# Patient Record
Sex: Female | Born: 1935
Health system: Southern US, Community
[De-identification: ages and names within clinical notes are randomized; demographics above are authoritative.]

---

## 2005-10-31 ENCOUNTER — Ambulatory Visit: Payer: Self-pay | Admitting: Internal Medicine

## 2005-12-10 ENCOUNTER — Other Ambulatory Visit: Payer: Self-pay

## 2005-12-10 ENCOUNTER — Inpatient Hospital Stay: Payer: Self-pay | Admitting: Internal Medicine

## 2005-12-23 ENCOUNTER — Ambulatory Visit: Payer: Self-pay

## 2006-11-13 ENCOUNTER — Other Ambulatory Visit: Payer: Self-pay

## 2006-11-13 ENCOUNTER — Inpatient Hospital Stay: Payer: Self-pay | Admitting: Internal Medicine

## 2006-11-14 ENCOUNTER — Other Ambulatory Visit: Payer: Self-pay

## 2007-10-29 ENCOUNTER — Emergency Department: Payer: Self-pay | Admitting: Emergency Medicine

## 2007-10-29 ENCOUNTER — Other Ambulatory Visit: Payer: Self-pay

## 2008-01-06 ENCOUNTER — Ambulatory Visit: Payer: Self-pay | Admitting: Cardiovascular Disease

## 2008-01-10 ENCOUNTER — Inpatient Hospital Stay: Payer: Self-pay | Admitting: Internal Medicine

## 2008-01-10 ENCOUNTER — Other Ambulatory Visit: Payer: Self-pay

## 2008-01-16 ENCOUNTER — Other Ambulatory Visit: Payer: Self-pay

## 2008-01-16 ENCOUNTER — Emergency Department: Payer: Self-pay | Admitting: Emergency Medicine

## 2009-12-15 ENCOUNTER — Emergency Department: Payer: Self-pay | Admitting: Emergency Medicine

## 2010-05-18 ENCOUNTER — Emergency Department: Payer: Self-pay | Admitting: Emergency Medicine

## 2010-11-07 ENCOUNTER — Ambulatory Visit: Payer: Self-pay | Admitting: Family Medicine

## 2011-01-31 ENCOUNTER — Ambulatory Visit: Payer: Self-pay

## 2011-03-22 ENCOUNTER — Inpatient Hospital Stay: Payer: Self-pay | Admitting: Student

## 2011-03-30 ENCOUNTER — Emergency Department: Payer: Self-pay | Admitting: Unknown Physician Specialty

## 2011-04-30 ENCOUNTER — Ambulatory Visit: Payer: Self-pay

## 2011-06-19 LAB — HEPATIC FUNCTION PANEL A (ARMC)
Albumin: 3.7 g/dL (ref 3.4–5.0)
Bilirubin, Direct: <0.1 mg/dL (ref 0.00–0.20)
SGOT(AST): 18 U/L (ref 15–37)
SGPT (ALT): 16 U/L
Total Protein: 7.3 g/dL (ref 6.4–8.2)

## 2011-06-19 LAB — CK TOTAL AND CKMB (NOT AT ARMC)
CK, Total: 56 U/L (ref 21–215)
CK-MB: <0.5 ng/mL — ABNORMAL LOW (ref 0.5–3.6)

## 2011-06-19 LAB — URINALYSIS, COMPLETE
Bilirubin,UR: NEGATIVE
Blood: NEGATIVE
Ph: 5 (ref 4.5–8.0)
Protein: NEGATIVE
RBC,UR: 3 /HPF (ref 0–5)
Squamous Epithelial: <1

## 2011-06-19 LAB — BASIC METABOLIC PANEL
Anion Gap: 10 (ref 7–16)
Calcium, Total: 9.1 mg/dL (ref 8.5–10.1)
Co2: 31 mmol/L (ref 21–32)
EGFR (Non-African Amer.): >60
Glucose: 103 mg/dL — ABNORMAL HIGH (ref 65–99)
Osmolality: 285 (ref 275–301)
Potassium: 3.3 mmol/L — ABNORMAL LOW (ref 3.5–5.1)

## 2011-06-19 LAB — CBC WITH DIFFERENTIAL/PLATELET
Eosinophil #: 0.1 10*3/uL (ref 0.0–0.7)
Eosinophil %: 1.2 %
HCT: 31.3 % — ABNORMAL LOW (ref 35.0–47.0)
Lymphocyte #: 2.2 10*3/uL (ref 1.0–3.6)
MCHC: 31.4 g/dL — ABNORMAL LOW (ref 32.0–36.0)
MCV: 79 fL — ABNORMAL LOW (ref 80–100)
Monocyte #: 0.4 10*3/uL (ref 0.0–0.7)
Neutrophil %: 63.3 %
Platelet: 184 10*3/uL (ref 150–440)
RDW: 18.2 % — ABNORMAL HIGH (ref 11.5–14.5)

## 2011-06-20 ENCOUNTER — Observation Stay: Payer: Self-pay | Admitting: Internal Medicine

## 2011-06-20 LAB — CBC WITH DIFFERENTIAL/PLATELET
Basophil #: 0 10*3/uL (ref 0.0–0.1)
Basophil %: 0.2 %
Eosinophil #: 0 10*3/uL (ref 0.0–0.7)
Eosinophil %: 0.1 %
HGB: 8.6 g/dL — ABNORMAL LOW (ref 12.0–16.0)
Lymphocyte %: 18.4 %
MCH: 25 pg — ABNORMAL LOW (ref 26.0–34.0)
MCHC: 31.5 g/dL — ABNORMAL LOW (ref 32.0–36.0)
MCV: 79 fL — ABNORMAL LOW (ref 80–100)
Monocyte #: 0.3 10*3/uL (ref 0.0–0.7)
Monocyte %: 4.6 %
Neutrophil %: 76.7 %
RBC: 3.42 10*6/uL — ABNORMAL LOW (ref 3.80–5.20)
RDW: 18 % — ABNORMAL HIGH (ref 11.5–14.5)
WBC: 7.4 10*3/uL (ref 3.6–11.0)

## 2011-06-20 LAB — IRON AND TIBC
Iron Bind.Cap.(Total): 185 ug/dL — ABNORMAL LOW (ref 250–450)
Iron: 33 ug/dL — ABNORMAL LOW (ref 50–170)

## 2011-06-20 LAB — BASIC METABOLIC PANEL
BUN: 19 mg/dL — ABNORMAL HIGH (ref 7–18)
Calcium, Total: 8 mg/dL — ABNORMAL LOW (ref 8.5–10.1)
Creatinine: 0.83 mg/dL (ref 0.60–1.30)
EGFR (Non-African Amer.): >60
Glucose: 142 mg/dL — ABNORMAL HIGH (ref 65–99)
Osmolality: 290 (ref 275–301)
Potassium: 3.7 mmol/L (ref 3.5–5.1)
Sodium: 143 mmol/L (ref 136–145)

## 2011-06-20 LAB — LIPID PANEL
Cholesterol: 90 mg/dL (ref 0–200)
HDL Cholesterol: 36 mg/dL — ABNORMAL LOW (ref 40–60)
Triglycerides: 48 mg/dL (ref 0–200)

## 2011-06-20 LAB — RAPID INFLUENZA A&B ANTIGENS

## 2011-06-20 LAB — FOLATE: Folic Acid: 19.1 ng/mL (ref 3.1–100.0)

## 2011-06-21 LAB — URINE CULTURE

## 2011-10-23 ENCOUNTER — Inpatient Hospital Stay: Payer: Self-pay | Admitting: Internal Medicine

## 2011-10-23 LAB — COMPREHENSIVE METABOLIC PANEL
Alkaline Phosphatase: 100 U/L (ref 50–136)
Calcium, Total: 9.3 mg/dL (ref 8.5–10.1)
Co2: 28 mmol/L (ref 21–32)
EGFR (Non-African Amer.): >60
Glucose: 104 mg/dL — ABNORMAL HIGH (ref 65–99)
Osmolality: 284 (ref 275–301)
Potassium: 4.2 mmol/L (ref 3.5–5.1)
SGOT(AST): 51 U/L — ABNORMAL HIGH (ref 15–37)
SGPT (ALT): 48 U/L
Sodium: 142 mmol/L (ref 136–145)

## 2011-10-23 LAB — URINALYSIS, COMPLETE
Bilirubin,UR: NEGATIVE
Glucose,UR: NEGATIVE mg/dL (ref 0–75)
Ketone: NEGATIVE
Leukocyte Esterase: NEGATIVE
Nitrite: NEGATIVE
Protein: 100
RBC,UR: 2 /HPF (ref 0–5)
Specific Gravity: 1.017 (ref 1.003–1.030)
Squamous Epithelial: NONE SEEN
WBC UR: 2 /HPF (ref 0–5)

## 2011-10-23 LAB — CBC
HCT: 35.4 % (ref 35.0–47.0)
MCH: 24.2 pg — ABNORMAL LOW (ref 26.0–34.0)
MCV: 79 fL — ABNORMAL LOW (ref 80–100)
RBC: 4.5 10*6/uL (ref 3.80–5.20)
RDW: 18.3 % — ABNORMAL HIGH (ref 11.5–14.5)

## 2011-10-23 LAB — CK TOTAL AND CKMB (NOT AT ARMC): CK-MB: 0.7 ng/mL (ref 0.5–3.6)

## 2011-10-23 LAB — PROTIME-INR: INR: 1

## 2011-10-24 LAB — BASIC METABOLIC PANEL
Anion Gap: 12 (ref 7–16)
BUN: 32 mg/dL — ABNORMAL HIGH (ref 7–18)
Calcium, Total: 8.9 mg/dL (ref 8.5–10.1)
Chloride: 101 mmol/L (ref 98–107)
Co2: 30 mmol/L (ref 21–32)
Creatinine: 1.2 mg/dL (ref 0.60–1.30)
EGFR (African American): 49 — ABNORMAL LOW
EGFR (Non-African Amer.): 42 — ABNORMAL LOW
Osmolality: 293 (ref 275–301)
Potassium: 4 mmol/L (ref 3.5–5.1)
Sodium: 143 mmol/L (ref 136–145)

## 2012-04-02 ENCOUNTER — Inpatient Hospital Stay: Payer: Self-pay | Admitting: Internal Medicine

## 2012-04-02 LAB — CBC
HCT: 33.3 % — ABNORMAL LOW (ref 35.0–47.0)
HGB: 10.6 g/dL — ABNORMAL LOW (ref 12.0–16.0)
MCHC: 32 g/dL (ref 32.0–36.0)
MCV: 80 fL (ref 80–100)
Platelet: 184 10*3/uL (ref 150–440)
RDW: 17.6 % — ABNORMAL HIGH (ref 11.5–14.5)
WBC: 7.7 10*3/uL (ref 3.6–11.0)

## 2012-04-02 LAB — COMPREHENSIVE METABOLIC PANEL
Bilirubin,Total: 0.3 mg/dL (ref 0.2–1.0)
Chloride: 103 mmol/L (ref 98–107)
Co2: 30 mmol/L (ref 21–32)
Creatinine: 0.76 mg/dL (ref 0.60–1.30)
EGFR (African American): >60
Potassium: 3.9 mmol/L (ref 3.5–5.1)
Sodium: 142 mmol/L (ref 136–145)

## 2012-04-02 LAB — URINALYSIS, COMPLETE
Bilirubin,UR: NEGATIVE
Blood: NEGATIVE
Nitrite: NEGATIVE
Ph: 5 (ref 4.5–8.0)
Specific Gravity: 1.009 (ref 1.003–1.030)
Squamous Epithelial: 1

## 2012-04-02 LAB — TSH: Thyroid Stimulating Horm: 0.506 u[IU]/mL

## 2012-04-02 LAB — CK TOTAL AND CKMB (NOT AT ARMC)
CK, Total: 59 U/L (ref 21–215)
CK-MB: 0.7 ng/mL (ref 0.5–3.6)

## 2012-04-02 LAB — TROPONIN I
Troponin-I: <0.02 ng/mL
Troponin-I: <0.02 ng/mL

## 2012-04-02 LAB — PROTIME-INR: INR: 1

## 2012-04-02 LAB — MAGNESIUM: Magnesium: 1.4 mg/dL — ABNORMAL LOW

## 2012-04-02 LAB — PHOSPHORUS: Phosphorus: 3.6 mg/dL (ref 2.5–4.9)

## 2012-04-03 LAB — LIPID PANEL
Cholesterol: 112 mg/dL (ref 0–200)
HDL Cholesterol: 40 mg/dL (ref 40–60)
VLDL Cholesterol, Calc: 25 mg/dL (ref 5–40)

## 2012-04-03 LAB — BASIC METABOLIC PANEL
Anion Gap: 8 (ref 7–16)
BUN: 14 mg/dL (ref 7–18)
Calcium, Total: 8.6 mg/dL (ref 8.5–10.1)
EGFR (African American): >60
EGFR (Non-African Amer.): >60
Glucose: 81 mg/dL (ref 65–99)
Osmolality: 279 (ref 275–301)
Potassium: 3.8 mmol/L (ref 3.5–5.1)

## 2012-04-03 LAB — CBC WITH DIFFERENTIAL/PLATELET
Basophil %: 0.7 %
Eosinophil %: 1.9 %
HGB: 9.9 g/dL — ABNORMAL LOW (ref 12.0–16.0)
MCH: 25.5 pg — ABNORMAL LOW (ref 26.0–34.0)
MCHC: 32 g/dL (ref 32.0–36.0)
MCV: 80 fL (ref 80–100)
Monocyte #: 0.3 x10 3/mm (ref 0.2–0.9)
Platelet: 148 10*3/uL — ABNORMAL LOW (ref 150–440)
RBC: 3.88 10*6/uL (ref 3.80–5.20)
RDW: 17.6 % — ABNORMAL HIGH (ref 11.5–14.5)
WBC: 5.6 10*3/uL (ref 3.6–11.0)

## 2012-04-03 LAB — HEMOGLOBIN A1C: Hemoglobin A1C: 5.3 % (ref 4.2–6.3)

## 2012-05-01 ENCOUNTER — Observation Stay: Payer: Self-pay | Admitting: Internal Medicine

## 2012-05-01 LAB — COMPREHENSIVE METABOLIC PANEL
Albumin: 3.8 g/dL (ref 3.4–5.0)
Anion Gap: 5 — ABNORMAL LOW (ref 7–16)
Bilirubin,Total: 0.3 mg/dL (ref 0.2–1.0)
Calcium, Total: 8.6 mg/dL (ref 8.5–10.1)
Chloride: 108 mmol/L — ABNORMAL HIGH (ref 98–107)
Co2: 27 mmol/L (ref 21–32)
EGFR (African American): >60
Potassium: 4.2 mmol/L (ref 3.5–5.1)
SGOT(AST): 21 U/L (ref 15–37)
Sodium: 140 mmol/L (ref 136–145)
Total Protein: 7.6 g/dL (ref 6.4–8.2)

## 2012-05-01 LAB — TROPONIN I
Troponin-I: <0.02 ng/mL
Troponin-I: 0.02 ng/mL

## 2012-05-01 LAB — PRO B NATRIURETIC PEPTIDE: B-Type Natriuretic Peptide: 401 pg/mL (ref 0–450)

## 2012-05-01 LAB — URINALYSIS, COMPLETE
Bacteria: NONE SEEN
Blood: NEGATIVE
Ketone: NEGATIVE
Ph: 5 (ref 4.5–8.0)
Protein: NEGATIVE
RBC,UR: 1 /HPF (ref 0–5)
Squamous Epithelial: <1

## 2012-05-01 LAB — CBC
HGB: 9.9 g/dL — ABNORMAL LOW (ref 12.0–16.0)
MCH: 25.1 pg — ABNORMAL LOW (ref 26.0–34.0)
MCHC: 31.5 g/dL — ABNORMAL LOW (ref 32.0–36.0)
Platelet: 203 10*3/uL (ref 150–440)

## 2012-05-02 LAB — TSH: Thyroid Stimulating Horm: 0.117 u[IU]/mL — ABNORMAL LOW

## 2012-05-02 LAB — CBC WITH DIFFERENTIAL/PLATELET
Basophil #: 0 x10 3/mm 3
Basophil %: 0.5 %
Eosinophil #: 0.1 x10 3/mm 3
Eosinophil %: 1.8 %
HCT: 29.5 % — ABNORMAL LOW
HGB: 9.8 g/dL — ABNORMAL LOW
Lymphocyte %: 29.8 %
Lymphs Abs: 1.2 x10 3/mm 3
MCH: 26.3 pg
MCHC: 33.1 g/dL
MCV: 79 fL — ABNORMAL LOW
Monocyte #: 0.6 "x10 3/mm "
Monocyte %: 14.1 %
Neutrophil #: 2.1 x10 3/mm 3
Neutrophil %: 53.8 %
Platelet: 179 x10 3/mm 3
RBC: 3.72 X10 6/mm 3 — ABNORMAL LOW
RDW: 17.4 % — ABNORMAL HIGH
WBC: 3.9 x10 3/mm 3

## 2012-05-02 LAB — T4, FREE: Free Thyroxine: 1.36 ng/dL

## 2012-05-02 LAB — BASIC METABOLIC PANEL WITH GFR
Anion Gap: 4 — ABNORMAL LOW
BUN: 17 mg/dL
Calcium, Total: 8.6 mg/dL
Chloride: 108 mmol/L — ABNORMAL HIGH
Co2: 31 mmol/L
Creatinine: 0.86 mg/dL
EGFR (African American): >60
EGFR (Non-African Amer.): >60
Glucose: 65 mg/dL
Osmolality: 285
Potassium: 4.4 mmol/L
Sodium: 143 mmol/L

## 2012-05-02 LAB — TROPONIN I: Troponin-I: <0.02 ng/mL

## 2012-05-02 LAB — MAGNESIUM: Magnesium: 2.4 mg/dL

## 2012-05-02 LAB — CK TOTAL AND CKMB (NOT AT ARMC)
CK, Total: 54 U/L (ref 21–215)
CK-MB: <0.5 ng/mL — ABNORMAL LOW (ref 0.5–3.6)

## 2012-05-02 LAB — HEMOGLOBIN A1C: Hemoglobin A1C: 5.3 % (ref 4.2–6.3)

## 2012-05-07 ENCOUNTER — Inpatient Hospital Stay: Payer: Self-pay | Admitting: Internal Medicine

## 2012-05-07 LAB — COMPREHENSIVE METABOLIC PANEL
Albumin: 3.9 g/dL (ref 3.4–5.0)
Alkaline Phosphatase: 64 U/L (ref 50–136)
BUN: 19 mg/dL — ABNORMAL HIGH (ref 7–18)
Bilirubin,Total: 0.3 mg/dL (ref 0.2–1.0)
Chloride: 103 mmol/L (ref 98–107)
Co2: 28 mmol/L (ref 21–32)
Creatinine: 0.99 mg/dL (ref 0.60–1.30)
EGFR (African American): >60
EGFR (Non-African Amer.): 53 — ABNORMAL LOW
Glucose: 159 mg/dL — ABNORMAL HIGH (ref 65–99)
Osmolality: 285 (ref 275–301)
SGOT(AST): 22 U/L (ref 15–37)
SGPT (ALT): 18 U/L (ref 12–78)

## 2012-05-07 LAB — CBC WITH DIFFERENTIAL/PLATELET
Basophil #: 0.1 10*3/uL (ref 0.0–0.1)
Eosinophil #: 0 10*3/uL (ref 0.0–0.7)
Eosinophil %: 0 %
HCT: 31.6 % — ABNORMAL LOW (ref 35.0–47.0)
Lymphocyte #: 3.2 10*3/uL (ref 1.0–3.6)
MCHC: 30.6 g/dL — ABNORMAL LOW (ref 32.0–36.0)
MCV: 80 fL (ref 80–100)
Monocyte %: 2 %
Neutrophil #: 20.6 10*3/uL — ABNORMAL HIGH (ref 1.4–6.5)
Platelet: 221 10*3/uL (ref 150–440)
RBC: 3.96 10*6/uL (ref 3.80–5.20)
RDW: 17.3 % — ABNORMAL HIGH (ref 11.5–14.5)
WBC: 24.4 10*3/uL — ABNORMAL HIGH (ref 3.6–11.0)

## 2012-05-07 LAB — TROPONIN I: Troponin-I: <0.02 ng/mL

## 2012-05-07 LAB — RAPID INFLUENZA A&B ANTIGENS

## 2012-05-08 LAB — URINALYSIS, COMPLETE
Bilirubin,UR: NEGATIVE
Glucose,UR: NEGATIVE mg/dL (ref 0–75)
RBC,UR: 11 /HPF (ref 0–5)
Specific Gravity: 1.021 (ref 1.003–1.030)
Squamous Epithelial: 5

## 2012-05-09 LAB — CBC WITH DIFFERENTIAL/PLATELET
Basophil #: 0.1 10*3/uL (ref 0.0–0.1)
Lymphocyte %: 10.6 %
MCH: 25.9 pg — ABNORMAL LOW (ref 26.0–34.0)
MCHC: 32.8 g/dL (ref 32.0–36.0)
Monocyte #: 0.7 x10 3/mm (ref 0.2–0.9)
Monocyte %: 2.7 %
Neutrophil %: 86.3 %
Platelet: 161 10*3/uL (ref 150–440)
RDW: 17.5 % — ABNORMAL HIGH (ref 11.5–14.5)
WBC: 25.9 10*3/uL — ABNORMAL HIGH (ref 3.6–11.0)

## 2012-05-09 LAB — BASIC METABOLIC PANEL
BUN: 20 mg/dL — ABNORMAL HIGH (ref 7–18)
Calcium, Total: 9.1 mg/dL (ref 8.5–10.1)
Chloride: 101 mmol/L (ref 98–107)
Creatinine: 0.96 mg/dL (ref 0.60–1.30)
Osmolality: 277 (ref 275–301)
Potassium: 3.4 mmol/L — ABNORMAL LOW (ref 3.5–5.1)

## 2012-05-10 LAB — CBC WITH DIFFERENTIAL/PLATELET
Basophil #: 0 10*3/uL (ref 0.0–0.1)
Eosinophil #: 0 10*3/uL (ref 0.0–0.7)
HCT: 30.1 % — ABNORMAL LOW (ref 35.0–47.0)
Lymphocyte %: 9.6 %
MCH: 24.6 pg — ABNORMAL LOW (ref 26.0–34.0)
MCHC: 31 g/dL — ABNORMAL LOW (ref 32.0–36.0)
Monocyte #: 0.7 x10 3/mm (ref 0.2–0.9)
Monocyte %: 3.6 %
Neutrophil #: 16 10*3/uL — ABNORMAL HIGH (ref 1.4–6.5)
Neutrophil %: 86.5 %
Platelet: 163 10*3/uL (ref 150–440)
RDW: 17.4 % — ABNORMAL HIGH (ref 11.5–14.5)

## 2012-05-10 LAB — CULTURE, BLOOD (SINGLE)

## 2012-05-10 LAB — BASIC METABOLIC PANEL
BUN: 17 mg/dL (ref 7–18)
Chloride: 99 mmol/L (ref 98–107)
Creatinine: 0.89 mg/dL (ref 0.60–1.30)
Osmolality: 278 (ref 275–301)
Potassium: 4.1 mmol/L (ref 3.5–5.1)

## 2012-05-11 LAB — CBC WITH DIFFERENTIAL/PLATELET
Basophil #: 0 10*3/uL (ref 0.0–0.1)
Basophil %: 0.3 %
Eosinophil #: 0.1 10*3/uL (ref 0.0–0.7)
Eosinophil %: 0.6 %
HCT: 28 % — ABNORMAL LOW (ref 35.0–47.0)
Lymphocyte #: 1.4 10*3/uL (ref 1.0–3.6)
Lymphocyte %: 14 %
MCHC: 35.5 g/dL (ref 32.0–36.0)
MCV: 79 fL — ABNORMAL LOW (ref 80–100)
Neutrophil #: 7.8 10*3/uL — ABNORMAL HIGH (ref 1.4–6.5)
Neutrophil %: 80.6 %
RBC: 3.55 10*6/uL — ABNORMAL LOW (ref 3.80–5.20)
RDW: 17.3 % — ABNORMAL HIGH (ref 11.5–14.5)
WBC: 9.7 10*3/uL (ref 3.6–11.0)

## 2012-06-16 ENCOUNTER — Emergency Department: Payer: Self-pay | Admitting: Emergency Medicine

## 2012-11-25 ENCOUNTER — Emergency Department: Payer: Self-pay

## 2012-11-25 LAB — CK TOTAL AND CKMB (NOT AT ARMC)
CK, Total: 79 U/L (ref 21–215)
CK-MB: 0.7 ng/mL (ref 0.5–3.6)

## 2012-11-25 LAB — COMPREHENSIVE METABOLIC PANEL
Albumin: 3.7 g/dL (ref 3.4–5.0)
Alkaline Phosphatase: 92 U/L (ref 50–136)
Anion Gap: 5 — ABNORMAL LOW (ref 7–16)
BUN: 16 mg/dL (ref 7–18)
Bilirubin,Total: 0.4 mg/dL (ref 0.2–1.0)
Calcium, Total: 9.1 mg/dL (ref 8.5–10.1)
Chloride: 106 mmol/L (ref 98–107)
Co2: 32 mmol/L (ref 21–32)
EGFR (Non-African Amer.): >60
Glucose: 118 mg/dL — ABNORMAL HIGH (ref 65–99)
Osmolality: 287 (ref 275–301)
Potassium: 3.7 mmol/L (ref 3.5–5.1)

## 2012-11-25 LAB — CBC
HCT: 32.1 % — ABNORMAL LOW (ref 35.0–47.0)
HGB: 10.5 g/dL — ABNORMAL LOW (ref 12.0–16.0)
MCHC: 32.5 g/dL (ref 32.0–36.0)
Platelet: 190 10*3/uL (ref 150–440)
RDW: 19.1 % — ABNORMAL HIGH (ref 11.5–14.5)
WBC: 7.1 10*3/uL (ref 3.6–11.0)

## 2012-11-25 LAB — PRO B NATRIURETIC PEPTIDE: B-Type Natriuretic Peptide: 596 pg/mL — ABNORMAL HIGH (ref 0–450)

## 2012-11-30 LAB — CULTURE, BLOOD (SINGLE)

## 2013-07-13 ENCOUNTER — Observation Stay: Payer: Self-pay | Admitting: Internal Medicine

## 2013-07-13 LAB — CK TOTAL AND CKMB (NOT AT ARMC)
CK, TOTAL: 47 U/L
CK-MB: 0.6 ng/mL (ref 0.5–3.6)

## 2013-07-13 LAB — CK-MB: CK-MB: <0.5 ng/mL — ABNORMAL LOW (ref 0.5–3.6)

## 2013-07-13 LAB — PRO B NATRIURETIC PEPTIDE: B-Type Natriuretic Peptide: 759 pg/mL — ABNORMAL HIGH (ref 0–450)

## 2013-07-13 LAB — CBC
HCT: 33.2 % — ABNORMAL LOW (ref 35.0–47.0)
HGB: 10.7 g/dL — ABNORMAL LOW (ref 12.0–16.0)
MCH: 24.1 pg — ABNORMAL LOW (ref 26.0–34.0)
MCHC: 32.2 g/dL (ref 32.0–36.0)
MCV: 75 fL — ABNORMAL LOW (ref 80–100)
PLATELETS: 208 10*3/uL (ref 150–440)
RBC: 4.43 10*6/uL (ref 3.80–5.20)
RDW: 21 % — ABNORMAL HIGH (ref 11.5–14.5)
WBC: 7 10*3/uL (ref 3.6–11.0)

## 2013-07-13 LAB — HEPATIC FUNCTION PANEL A (ARMC)
Albumin: 3.8 g/dL (ref 3.4–5.0)
Alkaline Phosphatase: 86 U/L
BILIRUBIN DIRECT: 0.1 mg/dL (ref 0.00–0.20)
BILIRUBIN TOTAL: 0.4 mg/dL (ref 0.2–1.0)
SGOT(AST): 15 U/L (ref 15–37)
SGPT (ALT): 18 U/L (ref 12–78)
TOTAL PROTEIN: 7.7 g/dL (ref 6.4–8.2)

## 2013-07-13 LAB — TROPONIN I

## 2013-07-13 LAB — BASIC METABOLIC PANEL
Anion Gap: 5 — ABNORMAL LOW (ref 7–16)
BUN: 22 mg/dL — ABNORMAL HIGH (ref 7–18)
CALCIUM: 9.4 mg/dL (ref 8.5–10.1)
CREATININE: 1.1 mg/dL (ref 0.60–1.30)
Chloride: 105 mmol/L (ref 98–107)
Co2: 31 mmol/L (ref 21–32)
EGFR (Non-African Amer.): 46 — ABNORMAL LOW
GFR CALC AF AMER: 54 — AB
Glucose: 216 mg/dL — ABNORMAL HIGH (ref 65–99)
OSMOLALITY: 291 (ref 275–301)
Potassium: 4.2 mmol/L (ref 3.5–5.1)
SODIUM: 141 mmol/L (ref 136–145)

## 2013-07-13 LAB — LIPASE, BLOOD: LIPASE: 222 U/L (ref 73–393)

## 2013-07-14 LAB — TROPONIN I: Troponin-I: <0.02 ng/mL

## 2013-07-14 LAB — LIPID PANEL
Cholesterol: 96 mg/dL (ref 0–200)
HDL Cholesterol: 35 mg/dL — ABNORMAL LOW (ref 40–60)
LDL CHOLESTEROL, CALC: 45 mg/dL (ref 0–100)
TRIGLYCERIDES: 80 mg/dL (ref 0–200)
VLDL Cholesterol, Calc: 16 mg/dL (ref 5–40)

## 2013-07-14 LAB — CK TOTAL AND CKMB (NOT AT ARMC)
CK, TOTAL: 48 U/L
CK-MB: 0.7 ng/mL (ref 0.5–3.6)

## 2013-07-14 LAB — TSH: THYROID STIMULATING HORM: 0.16 u[IU]/mL — AB

## 2013-07-28 ENCOUNTER — Ambulatory Visit: Payer: Self-pay | Admitting: Otolaryngology

## 2013-08-16 ENCOUNTER — Ambulatory Visit: Payer: Self-pay | Admitting: Otolaryngology

## 2014-07-06 IMAGING — CT CT ABDOMEN W/ CM
1 of 2 series · 15 of 32 positions shown, 19 images · IV contrast (isovue)
Comparison: 12/15/2009

REASON FOR EXAM: epigastric pain;    NOTE: Nursing to Give Oral CT
Contrast
COMMENTS:

PROCEDURE:     CT  - CT ABDOMEN STANDARD W  - April 02, 2012  [DATE]
RESULT:     History: Epigastric pain
TECHNIQUE: Multiple axial images of the abdomen were performed from the lung
bases to the iliac crests, with p.o. contrast and with 80 ml of Isovue 300
intravenous contrast.

[Series 2: 3mm soft tissue · axial · 0.65mm/px · z∈[-240,-51]mm · 15 of 71 slices shown, 19 images]
[im 4/71  soft-tissue]
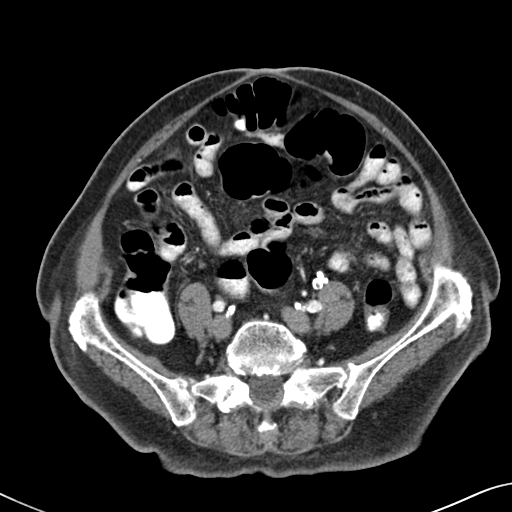
[im 4/71  bone]
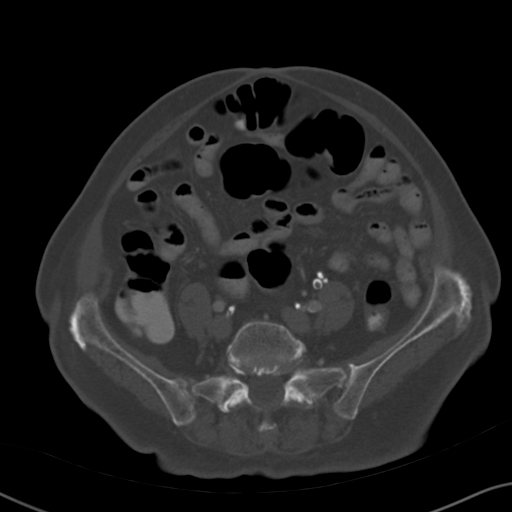
[im 10/71  soft-tissue]
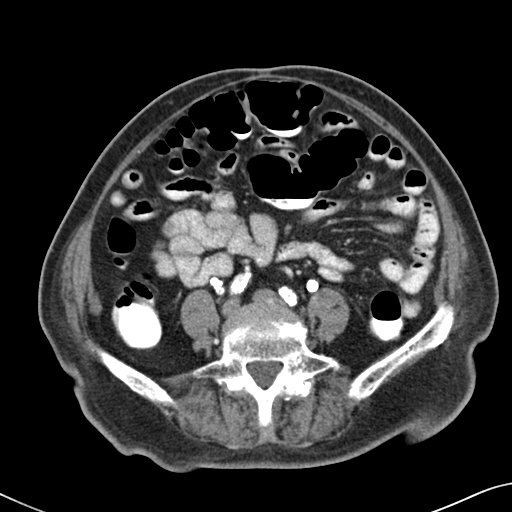
[im 16/71  soft-tissue]
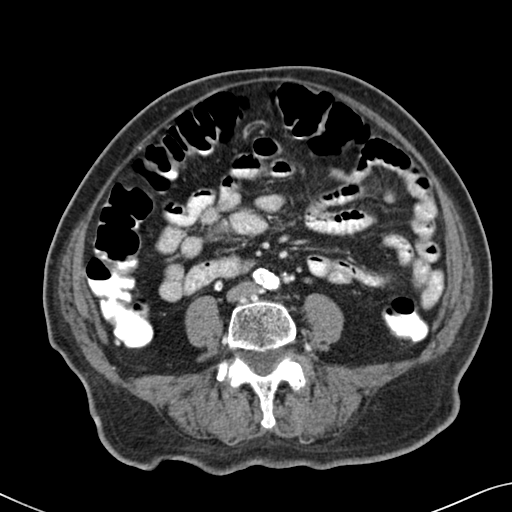
[im 19/71  soft-tissue]
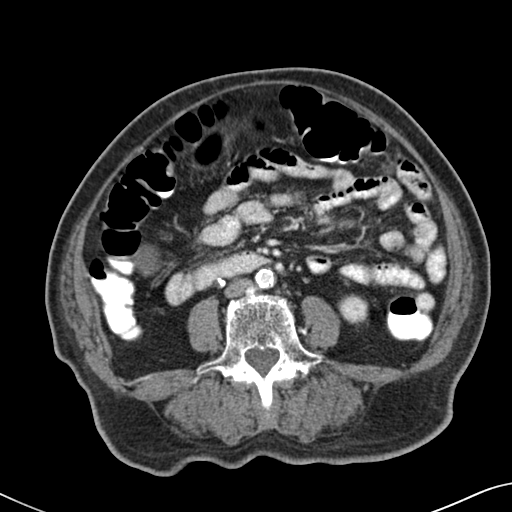
[im 25/71  soft-tissue]
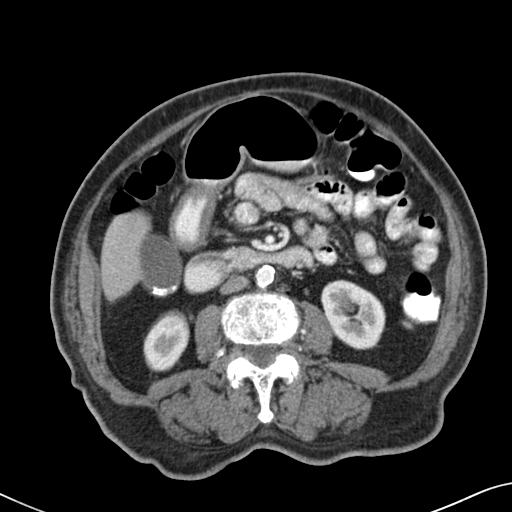
[im 31/71  soft-tissue]
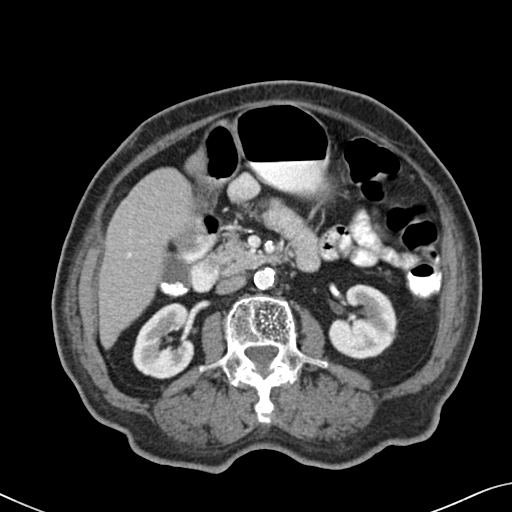
[im 37/71  soft-tissue]
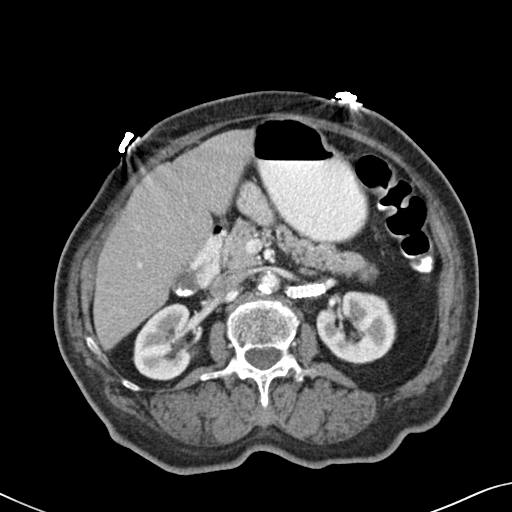
[im 40/71  soft-tissue]
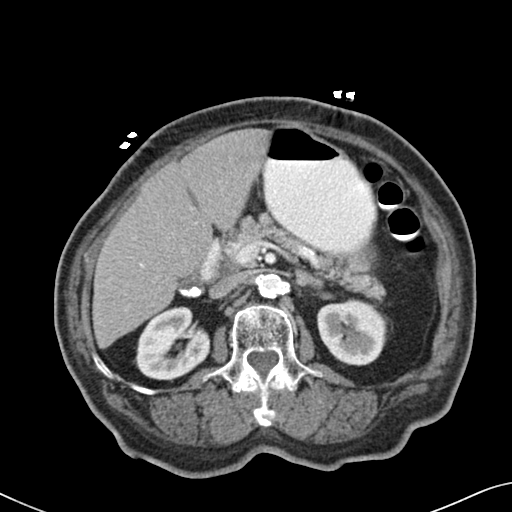
[im 46/71  soft-tissue]
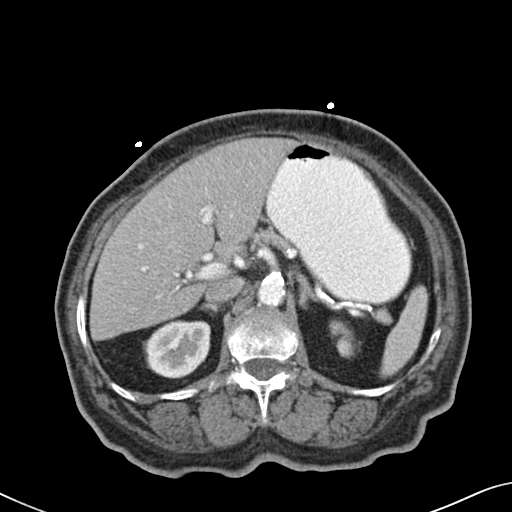
[im 46/71  bone]
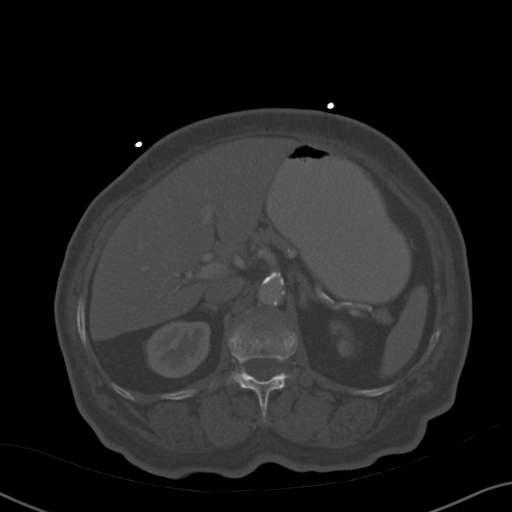
[im 52/71  soft-tissue]
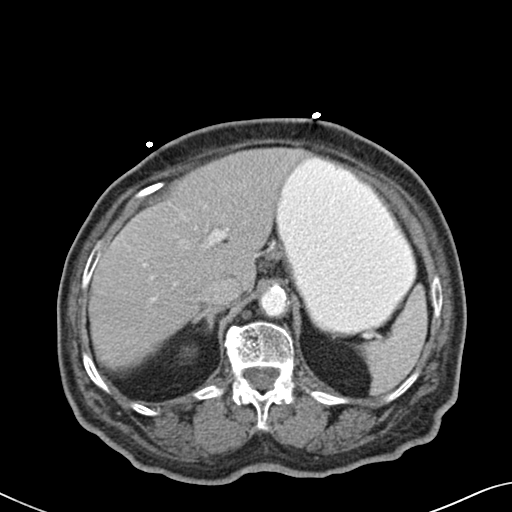
[im 55/71  soft-tissue]
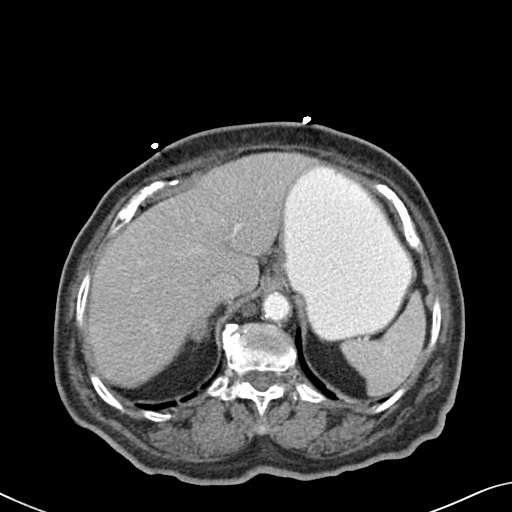
[im 58/71  lung]
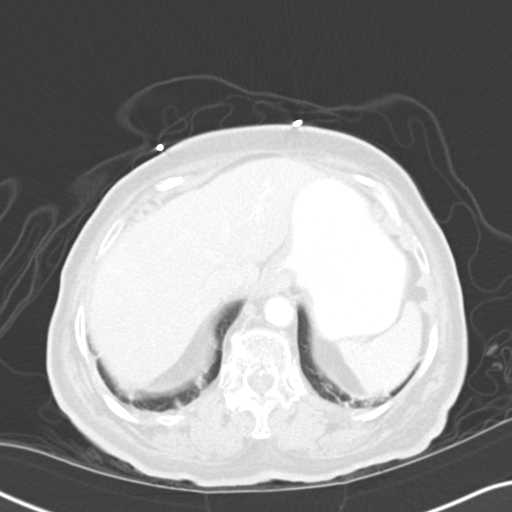
[im 61/71  soft-tissue]
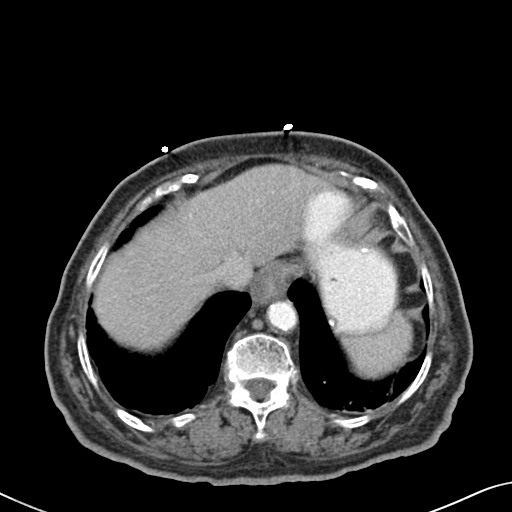
[im 61/71  lung]
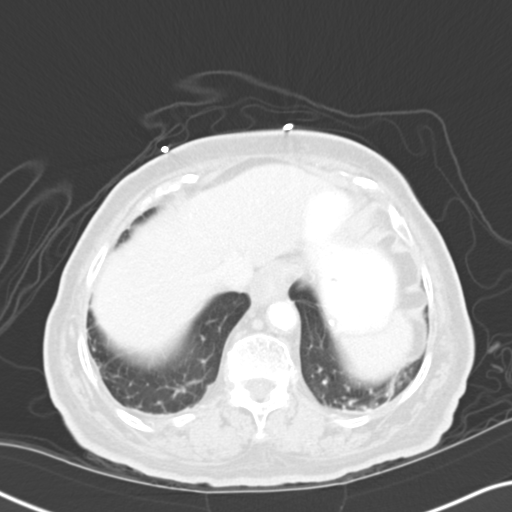
[im 64/71  lung]
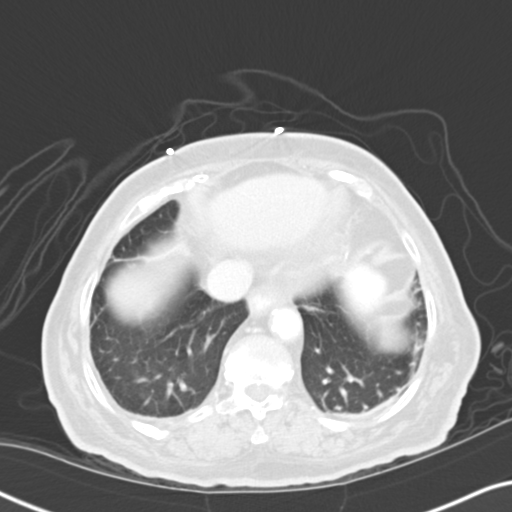
[im 67/71  soft-tissue]
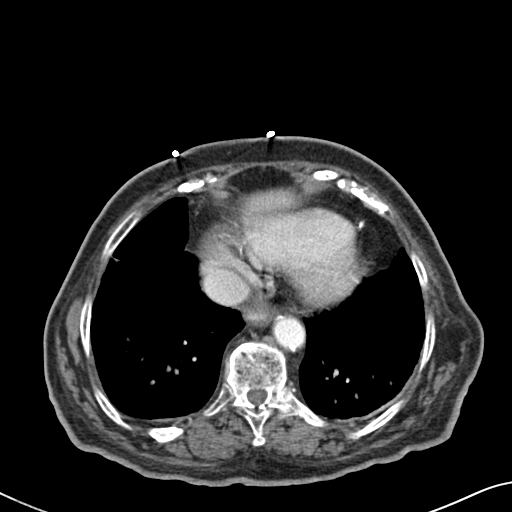
[im 67/71  lung]
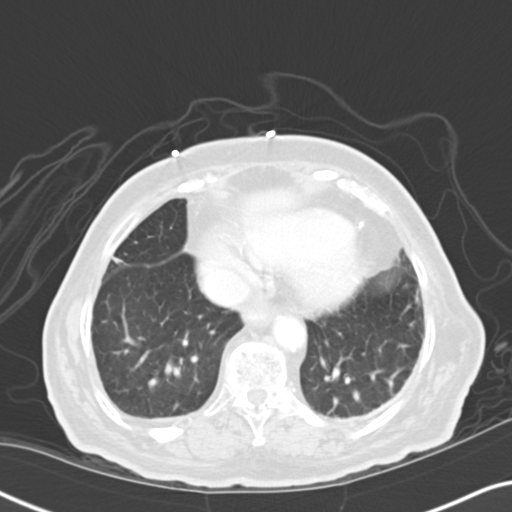

[15 of 32 positions shown; findings below may reference images not displayed]

FINDINGS: The lung bases are clear. There is no pneumothorax. The heart size is
enlarged. There is distal esophageal wall thickening as can be seen with
esophagitis.

The liver demonstrates no focal abnormality. There is no intrahepatic or
extrahepatic biliary ductal dilatation. There are cholelithiasis. The spleen
demonstrates no focal abnormality. The kidneys, adrenal glands, and pancreas
are normal.

The visualized portions of the stomach, duodenum, small intestine, and large
intestine demonstrate no contrast extravasation or dilatation. There is no
pneumoperitoneum, pneumatosis, or portal venous gas. There is no abdominal
free fluid. There is no lymphadenopathy.

The abdominal aorta is normal in caliber with atherosclerosis.

The osseous structures are unremarkable.
IMPRESSION: 1. Cholelithiasis.

2.  There is distal esophageal wall thickening as can be seen with
esophagitis.

[REDACTED]

## 2014-07-06 IMAGING — CR DG CHEST 1V PORT
1 series · 1 of 1 positions shown · non-contrast
Comparison: none

REASON FOR EXAM: chest pain
COMMENTS:

[ap]
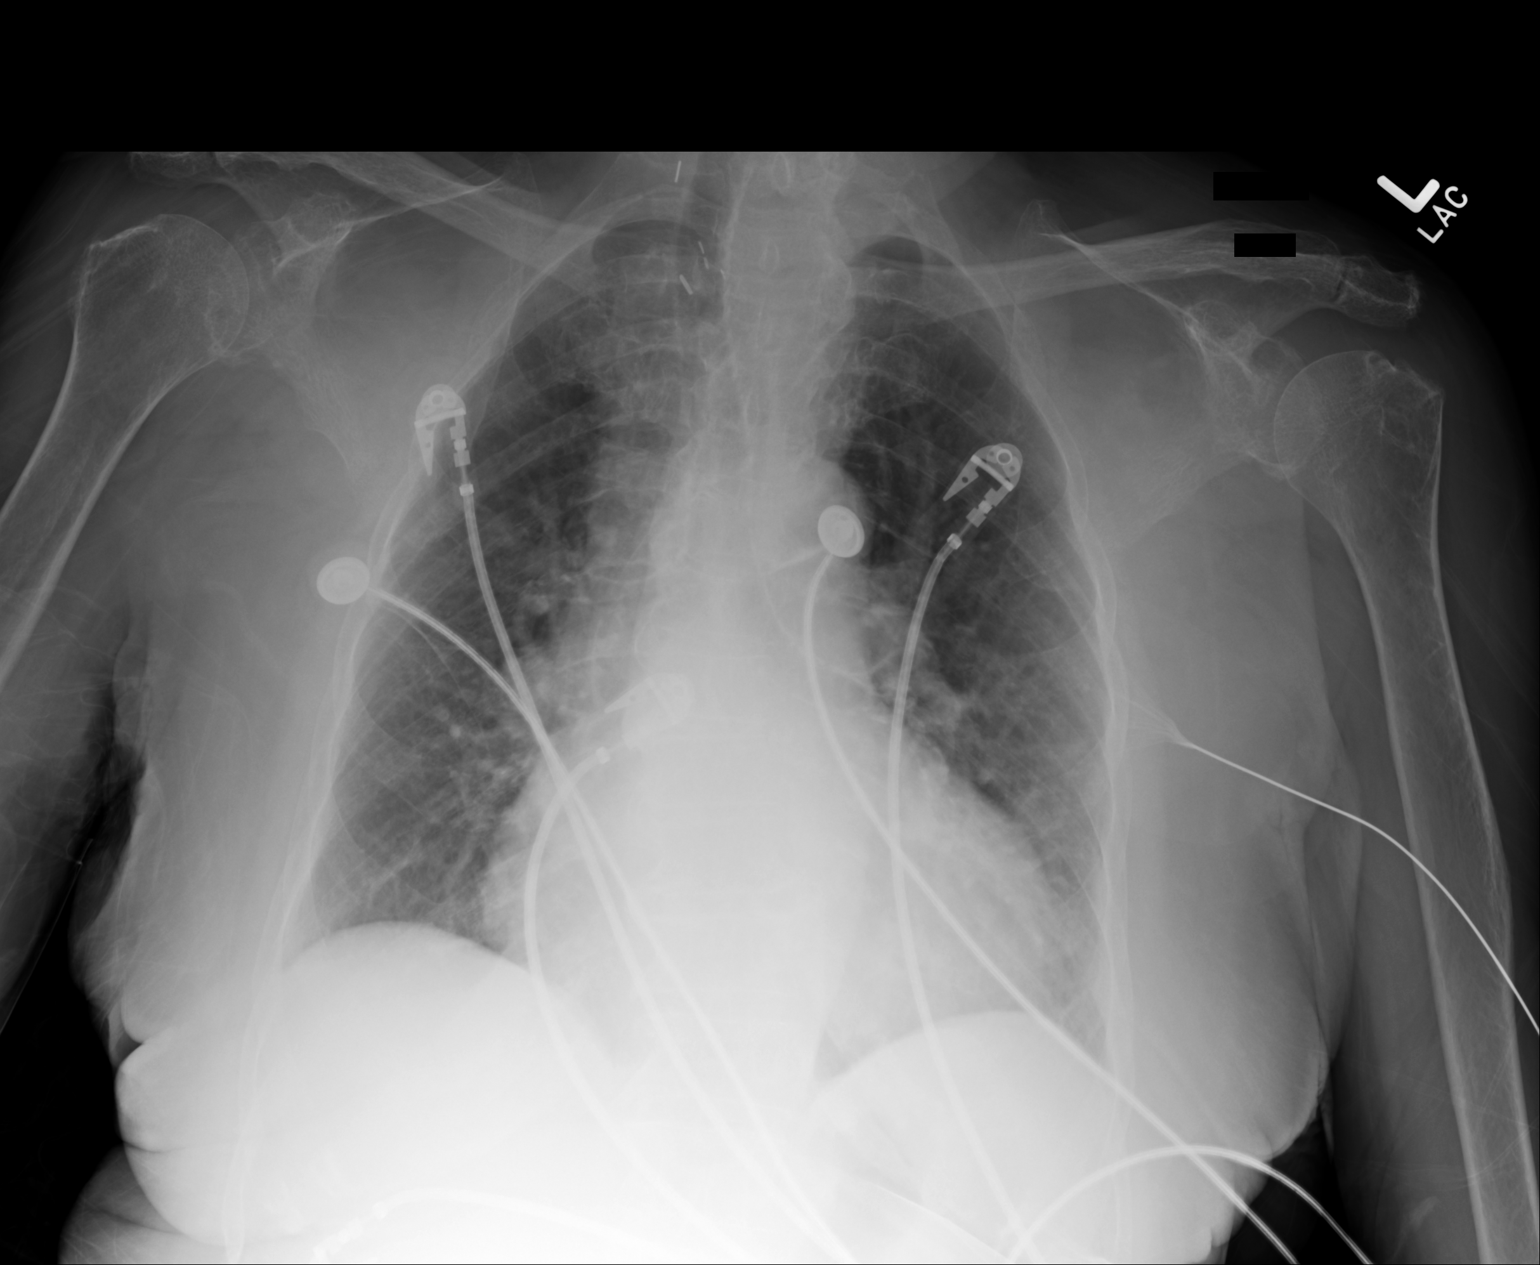

[1 of 1 positions shown; findings below may reference images not displayed]

PROCEDURE:     DXR - DXR PORTABLE CHEST SINGLE VIEW  - April 02, 2012  [DATE]

RESULT:     Comparison is made to the study October 23, 2011.

The lungs are well-expanded. There is no focal infiltrate. The cardiac
silhouette is enlarged. The pulmonary vascularity is engorged. The
mediastinum is normal in width. There is chronic deviation of the trachea
toward the right presumably from an enlarged left thyroid lobe. There are
surgical clips to the right of midline possibly from previous right-sided
thyroid surgery.
IMPRESSION: 1. The findings are consistent with low-grade CHF superimposed upon COPD.
2. There is deviation of the trachea toward the right presumably from the
left thyroid lobe.

[REDACTED]

## 2014-09-12 NOTE — Discharge Summary (Signed)
PATIENT NAME:  Beverly NorrieCHAVIS, Octavia M MR#:  [id number] DATE OF BIRTH:  07/05/1928  DATE OF ADMISSION:  05/01/2012 DATE OF DISCHARGE:  05/02/2012  DISCHARGE DIAGNOSES:  1. Atypical chest pain likely secondary to gastroesophageal reflux disease.  2. Stable coronary artery disease.  3. Hypertension.  4. Well-controlled diabetes mellitus.   CONSULTS: Dr. Lady GaryFath of cardiology.   IMAGING STUDIES: Chest x-ray showed no acute cardiopulmonary disease.   ADMITTING HISTORY AND PHYSICAL: Please see detailed History and Physical dictated on 05/01/2012.  In brief, this is an 81 year old African American female patient with history of coronary artery disease with recent negative stress test who presented to the Emergency Room complaining of midsternal chest pain. The patient was admitted to the hospitalist service to rule out acute coronary syndrome. The patient had three sets of cardiac enzymes which were negative. Dr. Lady GaryFath of cardiology was consulted, who saw the patient previously and suggested the pain was atypical and with recent negative stress test this was noncardiac pain. The patient had two EKGs which did not show any acute ST-T wave changes. Cardiac enzymes were normal with vitals within normal range, and the patient was discharged home with normal cardiac examination.   DISCHARGE MEDICATIONS:  1. Ferrous sulfate 325 mg oral daily.  2. Lantus 22 units subcutaneous daily.  3. Hydrochlorothiazide 12.5 mg oral daily.  4. Ranitidine 150 mg oral three times a day.  5. Vitamin D3 2000 international units daily.  6. Hydralazine 50 mg oral 3 times a day.  7. Potassium chloride 10 mEq daily.  8. Metoprolol 25 mg oral 2 times a day.  9. Simvastatin 10 mg oral daily.  10. Losartan 100 mg oral daily.  11. Metformin 1000 mg oral 2 times a day.  12. Lisinopril 20 mg oral daily.  13. Lasix 20 mg oral daily.  14. Tylenol 650 mg oral every four hours as needed for pain or fever.   DISCHARGE INSTRUCTIONS:   1. Follow up with primary care physician in a week.  2. Diabetic diet.  3. Activity as tolerated.  4. The patient was set up with Home Health services for physical therapy and nursing aide.   DISPOSITION: The patient was discharged home in fair condition.   TIME SPENT: Time spent on this discharge activity was 40 minutes.    ____________________________ Molinda BailiffSrikar R. Osceola Depaz, MD srs:bjt D: 05/07/2012 10:54:49 ET T: 05/07/2012 12:24:14 ET JOB#: [id number]  cc: Wardell HeathSrikar R. Taysen Bushart, MD, <Dictator> Orie FishermanSRIKAR R Dekendrick Uzelac MD ELECTRONICALLY SIGNED 05/09/2012 23:36

## 2014-09-12 NOTE — H&P (Signed)
PATIENT NAME:  Beverly Yates, Beverly Yates MR#:  [id number] DATE OF BIRTH:  07/05/1928  DATE OF ADMISSION:  05/08/2012  PRIMARY CARE PHYSICIAN: Dr. Bari EdwardLaura Berglund   REFERRING PHYSICIAN: Dr. Governor Rooksebecca Lord   CHIEF COMPLAINT: Chest pain, cough, and fever.   HISTORY OF PRESENT ILLNESS: Beverly Yates is an 81 year old African American female with a past medical history of diabetes mellitus and hypertension. She was admitted earlier this month for 24 hours for a work-up for chest pain that was noncardiac in origin. This time the patient presented to the Emergency Department with 24 hours history of right-sided chest pain associated with dry cough and fever. Evaluation here was consistent with the finding of right lower lobe pneumonia. The patient was admitted for further evaluation and treatment.    REVIEW OF SYSTEMS: CONSTITUTIONAL: The patient reports fever. She is unsure if she has chills but reports some fatigue. She also describes sweating. EYES: No blurring of vision. No double vision. ENT: No hearing impairment. No sore throat. No dysphagia. CARDIOVASCULAR: Reports chest pain and cough. Mild shortness of breath. No edema. No syncope. RESPIRATORY: Reports dry cough and chest pain. No hemoptysis. GASTROINTESTINAL: No abdominal pain. No vomiting. No diarrhea. GENITOURINARY: No dysuria. No frequency of urination. MUSCULOSKELETAL: No joint pain or swelling. No muscular pain or swelling. INTEGUMENTARY: No skin rash. No ulcers. NEUROLOGY: No focal weakness. No seizure activity. No headache. PSYCHIATRY: No anxiety. No depression. ENDOCRINE: No polyuria or polydipsia. No heat or cold intolerance. HEMATOLOGY: No easy bruisability. No lymph node enlargement.   PAST MEDICAL HISTORY:  1. Diabetes mellitus type 2, on Lantus.  2. Coronary artery disease.  3. Recent admission earlier this month. She had cardiac work-up and Cardiology. It was negative for cardiac chest pain. In other words, it was determined to be noncardiac in  origin.  4. The patient has history of diastolic congestive heart failure.  5. Systemic hypertension.  6. Hyperlipidemia.  7. Iron deficiency anemia.  8. Goiter.   PAST SURGICAL HISTORY: Cardiac stent implant and cataract surgery.   FAMILY HISTORY: Her father died from complications of pneumonia. Her mother died from reasons that she does not know.   SOCIAL HISTORY: She lives at home with her daughter. She never married.   SOCIAL HABITS: Nonsmoker. No history of alcohol or drug abuse.   ADMISSION MEDICATIONS: Acetaminophen p.r.n., ferrous sulfate 325 mg once a day, vitamin D3 2000 units once a day, furosemide 20 mg a day, hydrochlorothiazide 12.5 mg once a day, hydralazine 50 mg 3 times a day, Lantus 18 units at night, lisinopril 20 mg a day, metformin 1000 mg twice a day, metoprolol 25 mg twice a day, omeprazole 20 mg once a day, potassium chloride 10 mEq once a day, simvastatin 10 mg a day.   ALLERGIES: No known drug allergies.   PHYSICAL EXAMINATION:  VITAL SIGNS: Blood pressure 172/62, respiratory rate 26, pulse 117, temperature 102.7. Pulse oximetry 96%. It is unclear whether this is on oxygen or room air.   GENERAL APPEARANCE: Elderly female, thin-looking, lying in bed in no acute distress, closing her eyes most of the time.   HEENT: Head: No pallor. No icterus. No cyanosis. Ears: Examination revealed normal hearing, no lesions, no discharge. Nasal mucosa showed no discharge. No ulcers. Oropharyngeal area: Mouth was slightly dry. No oral thrush. No exudates. She is edentulous. Eyes: Normal iris and conjunctivae. Pupils about 2 to 3 mm, round, equal, difficult to detect reactivity to light being small pupils.   NECK: Supple. Trachea at  midline. No thyromegaly, at least by examination;  however, by chest x-ray there is goiter.   HEART: Exam revealed normal S1, S2, no S3 or S4. There is a grade 2/6 systolic murmur at the apex and left sternal border. No carotid bruits.   RESPIRATORY  The patient has mild tachypnea. She has decreased breathing sounds bilaterally. On the right lower lung field a few crackles were heard and little scattered wheezing intermittency and a few rhonchi.   ABDOMEN: Soft without tenderness. No hepatosplenomegaly. No masses. No hernias.   SKIN: No ulcers. No subcutaneous nodules.   MUSCULOSKELETAL: No joint swelling. No clubbing.   NEUROLOGIC: Cranial nerves II through XII are intact. No focal motor deficit. No sensory deficit.   PSYCHIATRY: The patient is alert, oriented to place and people. She is not interacting much, closing her eyes. Mood and affect is that of depression.   LABORATORY, DIAGNOSTIC AND RADIOLOGICAL DATA: Her EKG showed sinus tachycardia at rate of 113 per minute. Nonspecific T wave abnormalities. Unremarkable EKG.  Chest x-ray showed development of right lower lobe pneumonia. 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 Her CBC showed white count of 24,000, hemoglobin 9.7, hematocrit 31, platelet count 221. Influenza test was negative. Normal liver function tests and transaminases. Troponin was less than 0.02. Serum glucose was 159, BUN 19, creatinine 0.9, sodium 140, potassium 4.0.   ASSESSMENT:  1. Right lower lobe pneumonia.  2. Chest pain secondary to the pneumonia.  3. Diabetes mellitus type 2.  4. Diastolic congestive heart failure by history. The patient right now is not in failure.  5. Coronary disease.  6. Hypertension.  7. Hyperlipidemia.  8. Iron deficiency anemia.   PLAN: I will admit the patient to the medical floor. Blood cultures x 2 were taken. The patient received a dose of vancomycin and Zosyn with the assumption that the patient was here earlier this month in the hospital. However, she stayed only 24 hours, and I do not think that necessitates treatment for nosocomial or hospital-acquired infection. Rather, I will place the patient on Levaquin intravenously unless the patient does not do well, then  we might change the antibiotic. We will follow-up on the results of the blood cultures. Pain control with morphine and Tylenol p.r.n. Accu-Cheks 4 times a day and sliding scale. Continue home medications as listed above and monitor blood pressure as it is not adequately controlled. For deep vein thrombosis prophylaxis, I placed the patient on heparin 5000 units subcutaneous twice a day. She is already on Prilosec for peptic ulcer disease prophylaxis. She is taking it for gastroesophageal reflux disease.   TIME SPENT EVALUATING THIS PATIENT: More than 55 minutes, including reviewing her medical records.   ____________________________ Carney Corners. Rudene Re, MD amd:cbb D: 05/07/2012 23:44:38 ET T: 05/08/2012 08:36:31 ET JOB#: [id number]  cc: Carney Corners. Rudene Re, MD, <Dictator> Bari Edward, MD Karolee Ohs Dala Dock MD ELECTRONICALLY SIGNED 05/09/2012 22:36

## 2014-09-12 NOTE — H&P (Signed)
PATIENT NAME:  Beverly Yates, Beverly Yates MR#:  [id number] DATE OF BIRTH:  07/05/1928  DATE OF ADMISSION:  05/01/2012  ADMITTING PHYSICIAN: Dr. Enid Baasadhika Robertta Halfhill  PRIMARY CARE PHYSICIAN: Dr. Bari EdwardLaura Berglund  CHIEF COMPLAINT: Chest pain.   HISTORY OF PRESENT ILLNESS: Beverly Yates is an 81 year old African American female with past medical history significant for coronary artery disease status post stents in 2008 and 2009 with diffuse coronary artery disease on catheterization, hypertension, diabetes, hyperlipidemia who lives at home with her granddaughter was brought to hospital by EMS secondary to sudden onset of chest pain this morning. Patient is a very poor historian, there is no family at bedside, so I tried to call her granddaughter who was at work and could not come to the hospital now. She said she was not there this morning when patient's chest pain started. Patient was sick over the last couple of weeks more with cold, congestion, nausea, vomiting and was recently recovering over the last couple of days. According to patient she ate her breakfast this morning and little while later started to have burning chest pain in the upper chest and also left side of the chest. Again, patient's history is not very consistent. Denies any nausea, vomiting, shortness of breath at this time but according to family she was dyspneic over the last few days secondary to her recent cold and chest congestion.   Patient was recently here in 03/2012 for chest pain when she was seen by cardiologist, Dr. Lady GaryFath, had a normal stress test. Of note she also had a nonsustained wide complex tachycardia at the time and bigeminy and trigeminy and so was discharged on Holter monitor but she never followed up with Dr. Lady GaryFath after discharge according to granddaughter.   PAST MEDICAL HISTORY:  1. Diabetes mellitus.  2. Coronary artery disease, status post stents.  3. Hypertension.  4. Hyperlipemia.  5. Anemia of iron deficiency.  6. Diastolic  congestive heart failure.    PAST SURGICAL HISTORY:  1. Cataract surgery.  2. Cardiac stent placement.   ALLERGIES TO MEDICATIONS: No known drug allergies.   CURRENT MEDICATIONS:  1. Ferrous sulfate 325 mg p.o. daily.  2. Lantus 22 units sub-Q daily.  3. HCTZ 12.5 mg p.o. daily.  4. Ranitidine 150 mg p.o. b.i.d.  5. Vitamin D3 2000 international units p.o. daily.  6. Hydralazine 50 mg p.o. t.i.d.  7. Norvasc 10 mg p.o. daily.  8. Potassium chloride 10 mEq p.o. daily.  9. Metoprolol 25 mg p.o. b.i.d.  10. Simvastatin 10 mg p.o. daily.  11. Losartan 100 mg p.o. daily.  12. Metformin 1000 mg p.o. b.i.d.   13. Lisinopril 20 mg p.o. daily.  14. Lasix 20 mg p.o. daily. 15. Januvia 50 mg p.o. daily.  16. Tylenol 650 mg q.4 hours p.r.n. for pain or fever.   SOCIAL HISTORY: Lives at home with her granddaughter. No history of any smoking or alcohol use.   FAMILY HISTORY: Diabetes and hypertension in the family according to the old records.   REVIEW OF SYSTEMS: CONSTITUTIONAL: No fever, fatigue, or weakness. EYES: Positive for blurred vision and cataracts. No glaucoma or inflammation. ENT: No tinnitus, ear pain, hearing loss, epistaxis or discharge. Positive for grunting noise when she is speaking. RESPIRATORY: No cough, wheeze, hemoptysis, or chronic obstructive pulmonary disease. CARDIOVASCULAR: Positive for chest pain. No orthopnea, edema, arrhythmia, palpitations, or syncope. GASTROINTESTINAL: No nausea, vomiting, abdominal pain, hematemesis or melena. GENITOURINARY: No dysuria, hematuria, renal calculus, frequency, or incontinence. ENDOCRINE: No polyuria, nocturia, thyroid  problems, heat or cold intolerance. HEMATOLOGY: Positive for anemia. No easy bruising or bleeding. SKIN: No acne, rash or lesions. MUSCULOSKELETAL: No neck, back, shoulder pain, arthritis, or gout. NEUROLOGIC: No numbness, weakness, cerebrovascular accident, transient ischemic attack, or seizures. PSYCHOLOGICAL: No  anxiety, insomnia, or depression.   PHYSICAL EXAMINATION:  VITAL SIGNS: Temperature 98.6 degrees Fahrenheit, pulse 106, respirations 22, blood pressure 155/83, pulse oximetry 100% on room air.   GENERAL: Well built, well nourished elderly female curled up in bed, does not appear to be in any acute distress.   HEENT: Normocephalic, atraumatic. Pupils postsurgical, equal, round, reacting to light. Anicteric sclerae. Extraocular movements intact. Oropharynx clear with mild erythema of the posterior pharyngeal wall. No exudates or masses seen.   NECK: Supple. No thyromegaly, JVD or carotid bruits. No lymphadenopathy.    LUNGS: Moving air bilaterally. No wheeze or crackles. No use of accessory muscles for breathing.   CARDIOVASCULAR: S1, S2 regular rate and rhythm. 3/6 systolic murmur. No rubs or gallops.   ABDOMEN: Nontender, nondistended. No hepatosplenomegaly. Normal bowel sounds.   EXTREMITIES: No pedal edema. No clubbing or cyanosis. 2+ dorsalis pedis pulses palpable bilaterally.   SKIN: No acne, rash or lesions.   LYMPHATIC: No cervical lymphadenopathy.   NEUROLOGIC: Cranial nerves intact. No focal motor or sensory deficits.  PSYCH: Patient is awake, alert, oriented x3.   LABORATORY, DIAGNOSTIC AND RADIOLOGICAL DATA: WBC 9.4, hemoglobin 9.9, hematocrit 31.5, platelet count 203.   Sodium 140, potassium 4.2, chloride 108, bicarbonate 27, BUN 25, creatinine 0.96, glucose 96, calcium 8.6.   ALT 16, AST 21, alkaline phosphatase 57, total bilirubin 0.3, albumin 3.8. Urinalysis negative for any infection. First set of cardiac enzymes: CK 74, CK-MB less than 01, troponin less than 0.02. TSH is low at 0.164 and FT4 is elevated at 1.54. D-dimer elevated at 0.87. BNP within normal limits at 401. Chest x-ray on admission showing no acute cardiopulmonary disease. Widening of the superior mediastinum, deviation of trachea to right consistent with substernal goiter seen on prior CT chest. No  pulmonary opacities seen.  CT of the chest with contrast for PE showing no evidence of pulmonary embolus or segmental arteries in lower lobe limited. Indeterminate 6 mm nodule in the left lower lobe is similar to prior.   EKG showing sinus tachycardia, heart rate of 110.   ASSESSMENT AND PLAN: 81 year old female with past medical history of hypertension, diabetes, coronary artery disease status post stents in 2009 and also chronic anemia brought in from home secondary to chest pain. 1. Chest pain. Possibly GERD as she developed like a burning pain after she ate her breakfast and also she had a recent admission for chest pain and ruled out for myocardial infarction and she had a normal Myoview in 03/2012. However, due to her multiple bigeminy and trigeminy episodes will admit her under observation and she still has ongoing chest pain at this time. Recycle her cardiac enzymes and increased her metoprolol dose at this time. Will also consult Dr. Lady Gary. For possible GERD will start on IV Protonix at this time. If she improves she probably should follow up with GI as an outpatient versus inpatient.  2. Hypomagnesemia. Replaced at this time. Follow up in a.Yates.  3. Hyperthyroidism, slightly elevated FT4 without being on any external supplements. She also has known history of goiter so recheck thyroid labs in the morning and possible endocrinologist follow up as an outpatient. That could probably be causing her sinus tachycardia at this time.  4. Hypertension. Continue  home medications. Metoprolol dose is increased with her bigeminy, tachycardia.  5. Diabetes mellitus. On sliding scale insulin. Continue Januvia, Lantus and metformin. 6. Coronary artery disease status post stents. Recent normal Myoview about a month ago. Will continue medical management at this time. Will monitor on off unit telemetry. Recycle cardiac enzymes and follow up cardiology consult. 7. GI and DVT prophylaxis. IV Protonix and Lovenox.   8. CODE STATUS: FULL CODE.    TIME SPENT ON ADMISSION: 50 minutes.   ____________________________ Enid Baas, MD rk:cms D: 05/01/2012 17:58:34 ET T: 05/02/2012 08:18:25 ET JOB#: [id number]  cc: Enid Baas, MD, <Dictator> Bari Edward, MD Enid Baas MD ELECTRONICALLY SIGNED 05/02/2012 13:53

## 2014-09-12 NOTE — Discharge Summary (Signed)
PATIENT NAME:  Beverly Yates, Beverly Yates MR#:  [id number] DATE OF BIRTH:  07/05/1928  DATE OF ADMISSION:  05/07/2012 DATE OF DISCHARGE:  05/11/2012  PRIMARY CARE PHYSICIAN:  Bari EdwardLaura Berglund, MD  FINAL DIAGNOSES: 1. Sepsis from pneumococcus.  2. Pneumonia from pneumococcus.  3. History of congestive heart failure, no signs on this hospital stay.  4. Hypoglycemia with diabetes.  5. History of coronary artery disease.  6. Goiter. Recommend checking outpatient TFTs at followup appointment.  7. Chronic respiratory failure. Pulse oximetry 88% on room air.   MEDICATIONS ON DISCHARGE: 1. Ferrous sulfate 325 mg daily.  2. Hydrochlorothiazide 12.5 mg daily.  3. Vitamin D3 2000 international units daily.  4. Hydralazine 50 mg 3 times a day.  5. Potassium chloride 10 mEq daily.  6. Metoprolol 25 mg twice a day. 7. Simvastatin 10 mg daily.  8. Lisinopril 20 mg daily.  9. Lasix 20 mg daily.  10. Tylenol 650 mg every four hours as needed for pain or temperature.  11. Prilosec 20 mg daily.  12. Lantus 8 units subcutaneous injection at night, decreased from 18 units. 13. Levaquin 500 mg daily for 9 more days.   NOTE: Stop taking metformin.  OXYGEN: 1 liter nasal cannula.   REFERRALS: Physical therapy and nurse.   DIET: Low sodium, carbohydrate-controlled diet, regular consistency.   ACTIVITY: As tolerated.   DISCHARGE FOLLOWUP: Follow up with home health PT and R.N.   REASON FOR ADMISSION: The patient was admitted 05/07/2012 and discharged 05/11/2012. The patient came in with chest pain, cough and fever. She was admitted for pneumonia, started on IV Levaquin.   LABORATORY AND RADIOLOGICAL DATA DURING HOSPITAL COURSE: EKG showed sinus tachycardia at 119 beats per minute, no acute ST-T wave changes.   Troponin negative. Glucose 159, BUN 19, creatinine 0.99, sodium 140, potassium 4.0, chloride 103, CO2 28 and calcium 9.1. Liver function tests normal. White blood cell count 24.4, hemoglobin and  hematocrit 9.7 and 31.6 and platelet count 221.   Blood cultures grew out Streptococcus pneumoniae in all 4 bottles.   Influenza was negative.   Chest x-ray showed development of the right lower lobe pneumonia since previous exam, COPD with cardiomegaly and retrosternal thyroid goiter.  Urinalysis: Trace leukocyte esterase.   White blood cell count upon discharge 9.7.   Temperature afebrile. Pulse 74. The patient did have a pulse oximetry of 88% on room air so if she qualified for oxygen   HOSPITAL COURSE PER PROBLEM LIST:  1. For the patient's sepsis and pneumococcal, initially the patient in the ER received a dose of vancomycin and a dose of Zosyn, but switched by the admitting physician over to Levaquin. I added Rocephin once the blood cultures came back and had double coverage with Levaquin and Rocephin for a few days, switched over to Levaquin for a total of 14 day treatment course upon discharge.  2. For the pneumonia, do recommend followup chest x-ray in about 6 weeks.  3. For the history of CHF, there were no signs of CHF  on this hospital stay. I kept her on the usual medications.  4. For hypoglycemia with diabetes, I held the Lantus initially and then cut back to 8 units at night and the Glucophage was stopped.  5. History of coronary artery disease. No signs on this hospitalization. The patient is on metoprolol. 6. For the chronic respiratory failure, pulse oximetry was 88% on room air, qualified for oxygen. The patient was discharged home on oxygen.  7. The patient  does have a goiter. Recommend checking outpatient TFTs at follow-up appointment when the patient is not acutely sick.  TIME SPENT ON DISCHARGE: 35 minutes.  ____________________________ Herschell Dimes. Renae Gloss, MD rjw:sb D: 05/11/2012 15:54:51 ET T: 05/12/2012 09:21:33 ET JOB#: [id number]  cc: Herschell Dimes. Renae Gloss, MD, <Dictator> Bari Edward, MD Salley Scarlet MD ELECTRONICALLY SIGNED 05/12/2012 15:12

## 2014-09-12 NOTE — H&P (Signed)
PATIENT NAME:  Beverly Yates, Angles M MR#:  [id number] DATE OF BIRTH:  07/05/1928  DATE OF ADMISSION:  04/02/2012  ER REFERRING PHYSICIAN: Lowella FairyJohn Woodruff, MD  PRIMARY CARE PHYSICIAN: Beverly EdwardLaura Berglund, MD  CHIEF COMPLAINT: Chest pain, abdominal pain.   HISTORY OF PRESENT ILLNESS: The patient is an 81 year old female with past medical history of diabetes, CAD status post stent, hypertension, and hyperlipidemia who is a poor historian. She was last [facility]/2011 for mixed congestive heart failure exacerbation. The patient called EMS today because she was having some chest pain and burning. When EMS reached the patient they found her to be in bigeminy. In route to the hospital, the patient had about 10 seconds of wide complex tachycardia. She got IV amiodarone push by EMS and now has been started on amiodarone drip, by the ED physician. She is currently sinus bradycardic. Unfortunately no strips of the wide complex tachycardia were able to be taken. She also has hypomagnesemia. She continues to complain of some chest pain and burning. On examination she has epigastric tenderness. Her last echo is from October 2012 which showed an ejection fraction of 55%, LA moderately dilated, RA moderately dilated, moderate MR and TR. She is unable to tell me where and when she had her sent placed.   PAST MEDICAL HISTORY:  1. Diabetes. 2. Coronary artery disease status post stents. 3. Hypertension. 4. Hyperlipidemia. 5. History of malignant hypertension with headache.  6. Hypokalemia.  7. Hyperlipidemia.  8. Anemia of iron deficiency.   MEDICATIONS:  1. Aspirin 81 mg daily.  2. Lopressor 25 mg twice a day. 3. Zantac 150 mg twice a day. 4. KCl 10 milliequivalents daily.  5. Simvastatin 10 mg daily.  6. Metformin 1000 mg twice a day. 7. Losartan 100 mg daily.  8. Lisinopril 20 mg daily.  9. Lantus 22 units subcutaneously daily.  10. Lasix 20 mg daily.  11. Hydralazine 50 mg three times  daily. 12. Januvia 50 mg daily.  13. HCTZ 12.5 mg daily.  14. Norvasc 10 mg daily.  15. Vitamin D3 2000 international units daily.  16. Ferrous sulfate 325 mg daily.   PAST SURGICAL HISTORY: Cardiac stents.  FAMILY HISTORY: Positive for diabetes, hypertension, and heart disease.   SOCIAL HISTORY: The patient denies any history of smoking, alcohol or drug abuse. She lives with her granddaughter.   ALLERGIES: No known drug allergies.  REVIEW OF SYSTEMS: CONSTITUTIONAL: The patient denies any fever, fatigue, or weakness. EYES: Denies any blurred or double vision. ENT: Denies any tinnitus or ear pain. RESPIRATORY: Denies any cough or wheezing. CARDIOVASCULAR: Denies any palpitations or syncope. Reports burning type chest pain. GI: Denies any nausea, vomiting, or diarrhea. Has epigastric abdominal pain. GU: Denies any dysuria or hematuria. ENDOCRINE: Denies any polyuria or nocturia. HEME/LYMPH: Denies any anemia or easy bruisability. INTEGUMENT: Denies any acne or rash. MUSCULOSKELETAL: Denies any swelling or gout. NEUROLOGICAL: Denies any numbness or weakness. PSYCH: Denies any anxiety or depression.   PHYSICAL EXAMINATION:   VITAL SIGNS: Temperature afebrile, heart rate 51, respiratory rate 16, blood pressure 134/60, and pulse oximetry 98% on room air.   GENERAL: The patient is an elderly African American female lying in bed. She is mildly anxious.   HEAD: Atraumatic, normocephalic.  EYES: There is some pallor. No icterus or cyanosis. Pupils are equally round and reactive to light and accommodation. Extraocular movements intact.   ENT: Wet mucous membranes. No oropharyngeal erythema or thrush.   NECK: Supple. No masses. No JVD.  CHEST WALL: Tenderness to palpation. Not using accessory muscles of respiration. No intercostal muscle retractions.   PULMONARY: Lungs are bilaterally clear to auscultation. No wheezing, rales, or rhonchi.   CARDIOVASCULAR: S1 and S2 regular. There is a  systolic murmur. No rubs or gallops.   ABDOMEN: Soft. No guarding or rigidity. There is tenderness to palpation in the epigastric region. Normal bowel sounds.   SKIN: No rashes or lesions.   PERIPHERIES: No pedal edema. 1+ pedal pulses.  MUSCULOSKELETAL: No cyanosis or clubbing.   NEUROLOGICAL: The patient is awake and alert. Generalized weakness but no focal neurological deficits. Cranial nerves grossly intact.   PSYCH: Normal mood, mildly anxious.  RESULTS: Urinalysis shows no evidence of infection.   Chest x-ray: Low grade CHF superimposed upon COPD. There is chronic deviation of the trachea towards the right, presumably from the left thyroid lobe.  BNP 805. CK 66.   Glucose 105, BUN 17, creatinine 0.76, sodium 152, potassium 3.9, chloride 103, CO2 30, calcium 9.1, bilirubin 0.3, alkaline phosphatase 61, ALT 19, AST 10, total protein 7.6, albumin 3.9, and osmolality 285.  White count 7.7, hemoglobin 10.6, hematocrit 33.3, and platelet count 184.  Magnesium 1.4.  Phosphorus 3.6.   PT- INR and PTT normal.   TSH normal.   Cardiac enzymes normal.   ASSESSMENT AND PLAN: An 81 year old female with history of coronary artery disease, diabetes, and hypertension who presents with chest pain and found to be in bigeminy and had one episode of wide complex tachycardia in route to the hospital. 1. Bigeminy, wide complex tachycardia. As per the ED physician when EMS examined the patient she was found to be in bigeminy and had one 10 second episode of wide complex tachycardia. She received IV amiodarone push by EMS. No strips were able to be taken at that time. On reaching the Emergency Room, she has been started on amiodarone drip. The patient is a poor historian and does not provide good information. We will admit her to 2A, monitor on telemetry, continue amiodarone drip, we will obtain a cardiology consultation, check serial cardiac enzymes, repeat a 2-D echo and replace her  magnesium. 2. Chest pain. The patient describes the chest pain as burning. She also has pain in her epigastric area. We will hold her aspirin for the time being and check serial cardiac enzymes, echocardiogram, and obtain a cardiology consultation. 3. Epigastric pain. The patient is very tender to touch in the hypogastric area. We will hold her aspirin for the time being, continue her Zantac, and obtain a CT of the abdomen.  4. Anemia, likely of chronic disease and iron deficiency. We will continue her iron supplementation.  5. Diabetes. We will hold her metformin since she is going to get contrast for her CT of the abdomen. We will continue her Januvia and Lantus, place on insulin sliding scale, and a diabetic diet. 6. Hypomagnesemia. We will replace p.o. and IV. We will recheck in the a.m.  7. Hypertension, appears to be stable at present. She is on an amiodarone drip and is bradycardic. We will reduce the dose of her antihypertensives for the time being and reassess if she needs to be on so many multiple antihypertensive medications.  8. Vitamin D deficiency. We will continue supplementation.  9. Hyperlipidemia. We will continue statin therapy.   I reviewed all medical records, discussed with the ED physician, and discussed with the patient the plan of care and management.   TIME SPENT: 75 minutes. ____________________________ Darrick Meigs, MD  sp:slb D: 04/02/2012 15:24:51 ET T: 04/02/2012 15:59:26 ET JOB#: [id number]  cc: Darrick Meigs, MD, <Dictator> Beverly Edward, MD Darrick Meigs MD ELECTRONICALLY SIGNED 04/02/2012 20:13

## 2014-09-12 NOTE — Consult Note (Signed)
General Aspect 81 yo female with history of cad s/p cardiac caths in 2008 and 2009 with reported diffuse cad, history of hypertension and hyperlipidemia who is now asdmitted with complaints of chest pain. She had a reported run of nonsustained wide complex tachycardia by ems enroute with no available strips. She was given an iv bolus of amiodarone and placed on an amirodarone drip. She has no further chest ain and has ruled out for a mi thus far. She states her pain has resolved. She states she has a history of "heart burn" but feels that this episode may have been different. She is currently hemodynamically stable but has relative bradycardia with intermittant junctional rhythm. She has had intermittant pvcs but no sustained ventricular or supraventricular arrythmias. CT shows cholelithiasis with evidence of thickening of the esophagous. Abdominal ct and echo pending.   Physical Exam:   GEN no acute distress    HEENT PERRL    NECK supple  No masses    RESP clear BS  no use of accessory muscles    CARD Regular rate and rhythm  Murmur    Murmur Systolic  1-2/6    Systolic Murmur axilla    ABD denies tenderness  normal BS    LYMPH negative neck, negative axillae    EXTR negative cyanosis/clubbing    SKIN normal to palpation    NEURO cranial nerves intact, motor/sensory function intact    PSYCH A+O to time, place, person   Review of Systems:   Subjective/Chief Complaint chest pain    General: No Complaints    Skin: No Complaints    ENT: No Complaints    Eyes: No Complaints    Neck: No Complaints    Respiratory: No Complaints    Cardiovascular: Chest pain or discomfort    Gastrointestinal: Heartburn    Genitourinary: No Complaints    Vascular: No Complaints    Musculoskeletal: No Complaints    Neurologic: No Complaints    Hematologic: No Complaints    Endocrine: No Complaints    Psychiatric: No Complaints    Review of Systems: All other systems were  reviewed and found to be negative    Medications/Allergies Reviewed Medications/Allergies reviewed     MI - Myocardial Infarct:    chronic liver disease:    psoriasis:    shingles:    pulmonary edema:    Hypertension:    Hypercholesterolemia:    Diabetes Mellitus, Type II (NIDD):    neck lipoma removal:    left jaw surgery:   Home Medications: Medication Instructions Status  ferrous sulfate 325 mg (65 mg elemental iron) oral tablet 1 tab(s) orally once a day Active  Lantus 100 units/mL subcutaneous solution 22 unit(s) subcutaneous once a day (at bedtime) Active  hydrochlorothiazide 12.5 mg oral capsule 1 cap(s) orally once a day Active  ranitidine 150 mg oral tablet 1 tab(s) orally 2 times a day Active  Vitamin D3 2000 intl units oral tablet 1 tab(s) orally once a day Active  hydrALAZINE 50 mg oral tablet 1 tab(s) orally 3 times a day (with meals) Active  Norvasc 10 mg oral tablet 1 tab(s) orally once a day Active  potassium chloride 10 mEq oral tablet, extended release 1 tab(s) orally once a day Active  metoprolol succinate 25 mg oral tablet, extended release 1 tab(s) orally 2 times a day Active  simvastatin 10 mg oral tablet 1 tab(s) orally once a day Active  losartan 100 mg oral tablet 1 tab(s) orally  once a day Active  metformin 1000 mg oral tablet 1 tab(s) orally 2 times a day Active  lisinopril 20 mg oral tablet 1 tab(s) orally once a day Active  furosemide 20 mg oral tablet 1 tab(s) orally once a day Active  Januvia 50 mg oral tablet 1 tab(s) orally once a day Active   EKG:   Interpretation sr  with intermittant pvcs and intermittant junctional rhythm and sinus bradycardia    NKDA: None    Impression 81 with history of cad treated medically, history hypertension, hyperlipidemia and diabetes melitus who was admitted after complaining of chest pain. She has ruled out for an mi. She hasd a cardiac cath in 2008 and 2009 with diffuse cad treated medically. She has  history of heart burn and has evidence of gall stones on ct with no ductal enlargment. She also has evidence of esophageal thickening consistant with esophagitis. She had a reported wide complex tachycardia enroute to hospital. Was placed on iv amiodarone. No further runs of arrythmia. Due to bradycardia and junctional rhythm, and lack of tachycardia, will discontinue amiodarone and follow rhythm and symptoms. Echo is pending. Will consider functional study to evaluate for possible ischemia. Will attempt to aggresively treat cad medically, and treat gerd. Follow for further symtpoms.    Plan 1. Agressive treatement of her cad with prn nitrates, asa, beta blockers as heart rate tolerates, and review echo 2. Medically treate gerd 3. Ambulate off amiodarone and follow for evidence of symptoms and arrythmia. 4. Would consider out patient holter.   Electronic Signatures: Dalia Heading (MD)  (Signed [phone] 09:00)  Authored: General Aspect/Present Illness, History and Physical Exam, Review of System, Past Medical History, Home Medications, EKG , Allergies, Impression/Plan   Last Updated: 09-Nov-13 09:00 by Dalia Heading (MD)

## 2014-09-12 NOTE — Consult Note (Signed)
General Aspect 81 yo female with history of cad s/p cardiac caths in 08 and 09 with reported diffuse cad, history of hypertension and hyperlipidemia who is now admitted with complaints of chest pain. She was noted on last admission to have a run of nonsustained wide complex tachycardia by ems  with no available strips. She was given an iv bolus of amiodarone and placed on an amirodarone drip which was discontinued. She ruled out for an mi and was discharged with scheduled ouitpatient follow up which did not occur. She is now admitted with abdominal discomfort. She again has ruled out for an mi. Her funcitonal study on last admission was normal. She has sinius rhythm with bigeminy but no sustained arrythmia. She feels back at her baseline.   Physical Exam:   GEN no acute distress, obese    HEENT PERRL, hearing intact to voice    NECK supple    RESP normal resp effort  clear BS  no use of accessory muscles    CARD Regular rate and rhythm  Murmur  pvcs    Murmur Systolic  1-2/6    Systolic Murmur axilla    ABD denies tenderness  normal BS    LYMPH negative neck, negative axillae    EXTR negative cyanosis/clubbing    SKIN normal to palpation    NEURO cranial nerves intact, motor/sensory function intact    PSYCH A+O to time, place, person   Review of Systems:   Subjective/Chief Complaint epigastric pain    General: No Complaints    Skin: No Complaints    ENT: No Complaints    Eyes: No Complaints    Neck: No Complaints    Respiratory: No Complaints    Cardiovascular: No Complaints    Gastrointestinal: Heartburn    Genitourinary: No Complaints    Vascular: No Complaints    Musculoskeletal: No Complaints    Neurologic: No Complaints    Hematologic: No Complaints    Endocrine: No Complaints    Psychiatric: No Complaints    Review of Systems: All other systems were reviewed and found to be negative    Medications/Allergies Reviewed Medications/Allergies  reviewed   Home Medications: Medication Instructions Status  acetaminophen 325 mg oral tablet 2 tab(s) orally every 4 hours, As needed, pain or temp. greater than 100.4 Active  ferrous sulfate 325 mg (65 mg elemental iron) oral tablet 1 tab(s) orally once a day Active  Lantus 100 units/mL subcutaneous solution 22 unit(s) subcutaneous once a day (at bedtime) Active  hydrochlorothiazide 12.5 mg oral capsule 1 cap(s) orally once a day Active  ranitidine 150 mg oral tablet 1 tab(s) orally 2 times a day Active  Vitamin D3 2000 intl units oral tablet 1 tab(s) orally once a day Active  hydrALAZINE 50 mg oral tablet 1 tab(s) orally 3 times a day (with meals) Active  Norvasc 10 mg oral tablet 1 tab(s) orally once a day Active  potassium chloride 10 mEq oral tablet, extended release 1 tab(s) orally once a day Active  metoprolol succinate 25 mg oral tablet, extended release 1 tab(s) orally 2 times a day Active  simvastatin 10 mg oral tablet 1 tab(s) orally once a day Active  losartan 100 mg oral tablet 1 tab(s) orally once a day Active  metformin 1000 mg oral tablet 1 tab(s) orally 2 times a day Active  lisinopril 20 mg oral tablet 1 tab(s) orally once a day Active  furosemide 20 mg oral tablet 1 tab(s) orally once a  day Active  Januvia 50 mg oral tablet 1 tab(s) orally once a day Active   EKG:   EKG NSR    Interpretation sr with pvcs and bigeminy    NKDA: None    Impression 81 with history of cad treated medically, history hypertension, hyperlipidemia and diabetes melitus who was admitted after complaining of chest epigastric pain. She has ruled out for an mi. She has had  cardiac caths in 2008 and 2009 with diffuse cad treated medically. She has history of GERD and has evidence of gall stones on ct in the past with no ductal enlargment. She has been treated medically after a functional study during admission one month ago was negative for ischemia. EKG shows sinus rhythm with pvcs and bigeminy  but no ischemia.Pain is atypical for angina. Would continue with current meds including beta blockers for pvcs and amdbulate and consider discharge if stable.    Plan 1. Agressive treatement of her cad with prn nitrates, asa, beta blockers , and ambulate 2. Medically treate gerd 3.If stable would discharge on current meds 4. Will follow up as outpatient.   Electronic Signatures: Dalia Heading (MD)  (Signed 08-Dec-13 10:30)  Authored: General Aspect/Present Illness, History and Physical Exam, Review of System, Home Medications, EKG , Allergies, Impression/Plan   Last Updated: 08-Dec-13 10:30 by Dalia Heading (MD)

## 2014-09-12 NOTE — Discharge Summary (Signed)
PATIENT NAME:  Beverly Yates, Beverly Yates MR#:  [id number] DATE OF BIRTH:  [birth year]  DATE OF ADMISSION:  04/02/2012 DATE OF DISCHARGE:  04/05/2012  PRIMARY CARE PHYSICIAN: Dr. Florentina Jenny   DISCHARGE DIAGNOSES:  1. Wide-complex tachycardia with bigeminy.  2. Chest pain.  3. Anemia of chronic disease.  4. Hypertension.   CONSULTANT: Dr. Lady Gary of cardiology.   LABORATORY, DIAGNOSTIC AND RADIOLOGICAL DATA: CT scan of the abdomen with contrast which showed cholelithiasis.  Chest x-ray showed no acute cardiopulmonary disease.   Myocardial stress test showed no reversible ischemia. Ejection fraction 69%.   ADMITTING HISTORY AND PHYSICAL: Please see detailed history and physical dictated by Dr. Dava Najjar. In brief, 81 year old female patient with history of coronary artery disease, hypertension, hyperlipidemia presented to the hospital brought in by EMS after they were called for chest pain. Patient was found to be in bigeminy with wide complex tachycardia, was given IV amiodarone push, although no telemetry strips were recorded at that time. Patient in the hospital was seen in the ER was admitted to the hospitalist service for further work-up and treatment of this bigeminy and chest pain.   HOSPITAL COURSE:  1. Chest pain and bigeminy. Patient was seen by cardiology, Dr. Lady Gary, who suggested her chest pain was likely atypical but is secondary to the bigeminy. Patient went through a stress test which showed no reversible ischemia and patient is being discharged home on a beta blocker with a Holter for 48 hours to follow up with Dr. Lady Gary. Her cardiac enzymes have been normal. EKG showed no acute ST-T wave changes. Her bigeminy has resolved.  2. Cholelithiasis. Patient did have some mild abdominal pain on and off but no nausea or vomiting, was tolerating food. CT scan of the abdomen showed cholelithiasis and patient was not recommended any immediate surgery and will follow up with surgery as outpatient.   3. Patient's hypertension, anemia, chronic obstructive pulmonary disease were stable during the hospital stay.  4. On day of discharge patient has temperature 99, pulse 75, blood pressure 113/55, saturating 96% on room air with cardiovascular examination being normal and is being discharged home in stable condition.   DISCHARGE MEDICATIONS:  1. Ferrous sulfate 325 mg oral once a day.  2. Lantus 22 units subcutaneous once a day.  3. Hydrochlorothiazide 12.5 mg oral once a day.  4. Ranitidine 150 mg oral twice a day.  5. Vitamin D3 2000 international units once a day.  6. Hydralazine 50 mg oral 3 times a day.  7. Norvasc 10 mg oral once a day.  8. Potassium chloride 10 mEq oral once a day.  9. Metoprolol succinate 25 mg oral twice a day.  10. Simvastatin 10 mg oral once a day.  11. Losartan 100 mg oral once a day.  12. Metformin 1000 mg oral 2 times a day.  13. Lisinopril 20 mg oral once a day.  14. Lasix 20 mg oral once a day.  15. Januvia 50 mg oral once a day.  16. Acetaminophen 325 mg 2 tablets orally every four hours as needed for pain or fever.   DISCHARGE INSTRUCTIONS: Patient was discharged home on diabetic cardiac diet with less than 2 liters a day of fluids. Activity as tolerated. Patient was set up with home health with physical therapy as recommended by PT in the hospital. Patient was set up with 48 hour Holter, follow up with Dr. Lady Gary. She was advised to call her doctor or return to the Emergency Room if she has  any recurrent symptoms.        TIME SPENT: Time spent on the day of discharge in coordinating care and counseling of the patient was 35 minutes.  ____________________________ Molinda BailiffSrikar R. Toccara Alford, MD srs:cms D: 04/06/2012 15:03:48 ET T: 04/07/2012 11:47:53 ET JOB#: [id number]  cc: Wardell HeathSrikar R. Corben Auzenne, MD, <Dictator> Drue DunHenry F. Redmond Schoolripp, MD Bari EdwardLaura Berglund, MD Darlin PriestlyKenneth A. Lady GaryFath, MD  Orie FishermanSRIKAR R Detrick Dani MD ELECTRONICALLY SIGNED 04/10/2012 9:00

## 2014-09-16 NOTE — Consult Note (Signed)
PATIENT NAME:  Beverly Yates, Beverly Yates MR#:  [id number] DATE OF BIRTH:  07/05/1928  CARDIOLOGY CONSULTATION   DATE OF CONSULTATION:  07/14/2013  REFERRING PHYSICIAN: Prime Doc, Shreyang H. Allena KatzPatel, MD  CONSULTING PHYSICIAN:  Dwayne D. Juliann Paresallwood, MD  PRIMARY PHYSICIAN: Bari EdwardLaura Berglund, MD  CARDIOLOGIST: Darlin PriestlyKenneth A. Fath, MD   INDICATION: Chest pain, angina, with known coronary disease.   HISTORY OF PRESENT ILLNESS: Beverly Yates HeinzChavis is an 81 year old African-American female with known history of coronary disease, history of angioplasty and stenting, admitted for chest pain symptoms. The patient started having chest pain, sharp in nature, with substernal burning while she was washing dishes. It lasted about 2 hours. Got slightly better with a nitroglycerin patch. No significant shortness of breath. No nausea, vomiting or fevers. She has had occasional night sweats. Denies abdominal pain. Because of her persistent chest pain symptoms, she was admitted for further evaluation and care. She has had cardiac enzymes and EKGs, which were unremarkable. The patient was treated medically and allowed to be conservatively managed and now feels better.   PAST MEDICAL HISTORY: Diabetes, coronary artery disease, congestive heart failure, hypertension, hyperlipidemia, iron deficiency anemia, history of goiter.   PAST SURGICAL HISTORY: Angioplasty and stent, cataract surgery.   ALLERGIES: None.   FAMILY HISTORY: Pneumonia, hypertension.   SOCIAL HISTORY: Lives with her granddaughter. No smoking or alcohol consumption.   MEDICATIONS:  1. Amlodipine 10 a day.  2. Aspirin 81 a day.  3. Lasix 40 a day.  4. Hydralazine 100 twice a day. 5. Lantus 8 units at bedtime. 6. Lipitor 10 a day.  7. Lisinopril 20 a day.  8. Metoprolol 25 twice a day.  9. Ranitidine 150 mg twice a day.   REVIEW OF SYSTEMS: Shortness of breath, weakness, fatigue, chest pain, otherwise negative. Denies blackout spells or syncope. No nausea, vomiting. No  fever, no chills, no sweats. No weight loss, no weight gain. No hemoptysis or hematemesis. Denies bright red blood per rectum. No vision change or hearing change. No sputum production, no cough.   PHYSICAL EXAMINATION:  VITAL SIGNS: Blood pressure was 150/65, pulse of 70, respiratory rate of 16, afebrile.  HEENT: Normocephalic, atraumatic. Pupils equally reactive to light.  NECK: Supple. No significant JVD, bruits or adenopathy.  LUNGS: Clear to auscultation and percussion. No significant wheeze, rhonchi or rale.  HEART: Regular rate and rhythm. Positive bowel sounds. No rebound, guarding or tenderness.  EXTREMITIES: Within normal limits. No cyanosis, clubbing or edema.  NEUROLOGIC: Intact.  SKIN: Normal.   LABORATORIES: EKG: Normal sinus rhythm, nonspecific ST-T wave changes. Glucose of 216, BUN of 22, creatinine 1.12, sodium 141, potassium 4.2, chloride of 105, CO2 31. LFTs normal. Troponin less than 0.02. White count 7, hemoglobin of 10.7, platelet count of 208. D-dimer 0.52. Chest x-ray: Possible goiter on chest x-ray, otherwise mild pulmonary vascular congestion.  IMPRESSION: Angina, chest pain, coronary artery disease, hypertension, hyperlipidemia. diabetes, goiter, history of iron deficiency anemia, past history of congestive heart failure.   PLAN:  1. Agree with admit. Rule out for myocardial infarction. Place on telemetry. Continue with anticoagulation as you have done. Continue telemetry. Aspirin therapy. Short-term anticoagulation. Continue blood pressure control with metoprolol, lisinopril, hydralazine. 2. For shortness of breath and edema, continue Lasix therapy.  3. For diabetes, agree with Lantus therapy.  4. For gastroesophageal reflux disease, continue Zantac.  5. Because the patient has done reasonably well and improved with anginal symptoms, would consider adding Imdur to her regimen for possible anginal symptoms since she  has known coronary disease. Do not recommend cardiac  catheterization at this point. Will treat the patient medically and conservatively, allow the patient probably to be discharged soon with followup with Dr. Lady Gary as an outpatient within the next 1 to 2 weeks.   ____________________________ Bobbie Stack Juliann Pares, MD ddc:lb D: 07/14/2013 11:58:51 ET T: 07/14/2013 12:32:52 ET JOB#: [id number]  cc: Dwayne D. Juliann Pares, MD, <Dictator> Alwyn Pea MD ELECTRONICALLY SIGNED 08/22/2013 6:58

## 2014-09-16 NOTE — Discharge Summary (Signed)
PATIENT NAME:  Beverly Yates, Noralee M MR#:  [id number] DATE OF BIRTH:  07/05/1928  DATE OF ADMISSION:  07/13/2013 DATE OF DISCHARGE:  07/14/2013  PRESENTING COMPLAINT: Chest pain.   DISCHARGE DIAGNOSES:  1. Chest pain, resolved, appears atypical.  2. Hypertension.  3. Type 2 diabetes.  4. History of coronary artery disease.  5. Asymptomatic gallstones.   CODE STATUS: FULL CODE.   MEDICATIONS:  1. Lisinopril 20 mg daily.  2. Aspirin 81 mg daily.  3. Metoprolol 25 mg b.i.d.  4. Hydralazine 100 mg b.i.d.  5. Furosemide 40 mg daily.  6. Amlodipine 10 mg daily.  7. Ranitidine 150 b.i.d.  8. Lipitor 10 mg daily at bedtime.  9. Lantus 8 units at bedtime.   DIET: Carbohydrate controlled.    FOLLOWUP:  1. With Dr. Florentina JennyHenry Tripp.  2. Follow up with Dr. Lady GaryFath in 2 to 4 weeks.   CARDIOLOGY CONSULTATION: By Dr. Juliann Paresallwood.   LABORATORY:  1. Cardiac enzymes x 3 negative. Lipid profile within normal limits.  2. Ultrasound of the abdomen shows gallbladder sludge with small gallstones.  3. EKG shows normal sinus rhythm with mild left atrial enlargement.  4. Creatinine is 1.10. Sodium is 141. Potassium is 4.2. Chloride is 105. Bicarb is 31.  5. Chest x-ray: Left paratracheal soft tissue mass consistent with a goiter. Surgical clips are noted over the upper chest and lower neck. Cardiomegaly with pulmonary vascular congestion. No overt pulmonary edema.  6. Hepatic function panel within normal limits. Lipase was 222.   BRIEF SUMMARY OF HOSPITAL COURSE: Beverly Yates is a very pleasant, 81 year old, African American female with history of CAD, presents with chest pain. She was admitted with:  1. Chest pain: She was admitted on telemetry floor. Cardiac enzymes were negative x 3. She did not have any further episodes of chest pain. Seen by cardiology. No further workup needed. Continued all of her cardiac meds, including her blood pressure meds. The patient does have a history of diffuse coronary artery disease  per cath report of 2 years ago.  2. Hypertension: Hydralazine, Norvasc, lisinopril and metoprolol were continued.  3. Hyperlipidemia: Continued atorvastatin.  4. Type 2 diabetes: Lantus and sliding scale were continued.  5. Asymptomatic gallstones: LFTs were normal, and the patient did not have any right upper quadrant pain.   Discharge plan was discussed with the patient's granddaughter.   TIME SPENT: 40 minutes.    ____________________________ Wylie HailSona A. Allena KatzPatel, MD sap:gb D: 07/14/2013 15:57:45 ET T: 07/14/2013 21:14:08 ET JOB#: [id number]  cc: Tarvaris Puglia A. Allena KatzPatel, MD, <Dictator> Drue DunHenry F. Redmond Schoolripp, MD Darlin PriestlyKenneth A. Lady GaryFath, MD Willow OraSONA A Toron Bowring MD ELECTRONICALLY SIGNED 07/24/2013 13:37

## 2014-09-16 NOTE — H&P (Signed)
PATIENT NAME:  Beverly Yates, Beverly Yates MR#:  [id number] DATE OF BIRTH:  07/05/1928  DATE OF ADMISSION:  07/13/2013  PRIMARY CARE PROVIDER: Bari EdwardLaura Berglund, MD  ED  REFERRING PHYSICIAN: Cecille AmsterdamJonathan E. Mayford KnifeWilliams, MD  CHIEF COMPLAINT: Chest pain.   HISTORY OF PRESENT ILLNESS: The patient is an 81 year old African American female with previous history of coronary artery disease, previous stent, angioplasty, who has been admitted for various reasons here and has been seen by cardiology in the past. Last time she was seen by cardiology was in December 2013. She is a very poor historian. She did not know that she had heart disease but states that she started having chest pain, sharp in nature, in the substernal region, a burning type of sensation while she was washing her dishes, lasted about 2 hours, now is improved after having a nitroglycerin patch placed. She did not have associated shortness of breath. No nausea, vomiting or fevers. She reports occasional night sweats. Denies any abdominal pain. No nausea, vomiting, urinary frequency, urgency or hesitancy. The patient did not recall that she ever had chest pain.   PAST MEDICAL HISTORY: Significant for:  1.  Diabetes type 2.  2.  Coronary artery disease and has had cardiac cath but these done at another hospital. According to Dr. America BrownFath's note from May 02, 2012, he reported diffuse coronary artery disease; however, there is no cath done here.  3.  History of diastolic CHF.   4.  Hypertension.  5.  Hyperlipidemia.  6.  Iron deficiency anemia.  7.  History of goiter.     PAST SURGICAL HISTORY: Status post multiple cardiac stent implants and cataract surgery.   ALLERGIES: None.   FAMILY HISTORY: Father died from complications of pneumonia. Mother died from reasons that she is not able to recall.   SOCIAL HISTORY: Lives at home with her granddaughter. Does not smoke, does not drink.   MEDICATIONS AT HOME:  Amlodipine 10 daily, aspirin 81 mg 1 tab p.o.  daily, Lasix 40 daily, hydralazine 100 mg 1 tab p.o. b.i.d., Lantus 8 units at bedtime, Lipitor 10 mg daily, lisinopril 20 daily, metoprolol tartrate 25 mg 1 tab p.o. b.i.d., ranitidine 150 mg 1 tab p.o. b.i.d.  REVIEW OF SYSTEMS: CONSTITUTIONAL: Denies any fevers, fatigue, weakness. Complains of chest pain. No weight loss. No weight gain.  EYES: No pain. No redness. No inflammation.  ENT: No tinnitus. No ear pain. Does have some hearing loss. No difficulty swallowing.  RESPIRATORY: Denies any cough, wheezing, hemoptysis. No COPD.  CARDIOVASCULAR: Complains of chest pain. No orthopnea. No edema. No arrhythmia.  GASTROINTESTINAL: No nausea, vomiting, diarrhea. No abdominal pain. No hematemesis. Does have GERD, no IBS, no jaundice.  GENITOURINARY: Denies any frequency, urgency or hesitancy.  ENDOCRINE:  Denies any polyuria or nocturia.  HEMATOLOGIC AND LYMPHATIC: Denies anemia, easy bruisability or bleeding.  SKIN: No acne. No rash.  MUSCULOSKELETAL: Pain related to osteoarthritis.  NEUROLOGIC:  No numbness. No CVA, no TIA. PSYCHIATRIC: No anxiety. No insomnia.   PHYSICAL EXAMINATION: VITAL SIGNS: Temperature 99.6, pulse 65, respirations 18, blood pressure 173/74, O2 94%.  GENERAL: The patient is a thin African American female in no acute distress.  HEENT: Head atraumatic, normocephalic. Pupils are equal, round, reactive to light and accommodation. There is no conjunctival pallor. No scleral icterus. Nasal exam shows no drainage or ulceration. Oropharynx is clear without any exudate.  NECK: Supple without any JVD.  CARDIOVASCULAR: Regular rate and rhythm. No murmurs, rubs, clicks or gallops.  LUNGS: Clear  to auscultation bilaterally without any rales, rhonchi, wheezing.  ABDOMEN: Soft, nontender, nondistended. Positive bowel sounds x 4.  EXTREMITIES: No clubbing, cyanosis or edema.   LABORATORY, DIAGNOSTIC AND RADIOLOGICAL DATA:  EKG shows normal sinus rhythm without any ST-T wave changes.  Glucose 216, BUN 22, creatinine 1.10, sodium 141, potassium 4.2, chloride 105, CO2 is 31. LFTs are normal. Troponin less than 0.02. WBC 7.0, hemoglobin 10.7, platelet count 208. D-dimer 0.52. Chest x-ray shows left paratracheal soft tissue mass most consistent with a goiter, cardiomegaly with pulmonary vascular congestion.   ASSESSMENT AND PLAN: The patient is an 81 year old African American female with history of coronary artery disease. The details of the coronary anatomy are not available, presents with chest pain.  1.  Chest pain. Per records, it seems that she has diffuse coronary artery disease and this could be related to angina. At this time, we will place the patient under observation, place her on aspirin. I will ask cardiology to evaluate. Likely not a candidate for any intervention. Her stress test was negative in 2013. At this point, would likely not benefit from a stress test.  2.  Hypertension. We will continue hydralazine, lisinopril and metoprolol. Hold her Norvasc due to Nitro patch being added.  3.  Hyperlipidemia. Continue atorvastatin.  4.  Diabetes. We will continue Lantus and sliding-scale insulin, check a hemoglobin A1c.    NOTE: Time spent on this patient, 45 minutes.     ____________________________ Lacie Scotts Allena Katz, MD shp:cs D: 07/13/2013 18:15:00 ET T: 07/13/2013 18:44:36 ET JOB#: [id number]  cc: Abdou Stocks H. Allena Katz, MD, <Dictator> Charise Carwin MD ELECTRONICALLY SIGNED 07/15/2013 14:38

## 2014-09-16 NOTE — Consult Note (Signed)
Brief Consult Note: Diagnosis: Cp/Angina /CAD.   Patient was seen by consultant.   Consult note dictated.   Orders entered.   Discussed with Attending MD.   Comments: IMP CAD Angina HTN Hyperlipidemia DM CP CHF . PLAN ASA 81 mg daily Add Imdur 30 mg daily Continue HTN control with Amilodapine/Lisinopril/Hydralazine/Metoprolol Continue Lipitor  Agree with DM treatment/ Lantus/ GERD zantac po Lasix for CHF OK to d/c home today f/u as outpt with Mariel KanskyKen Fath MD 1-2 weeks.  Electronic Signatures: Dorothyann Pengallwood, Dwayne D (MD)  (Signed 19-Feb-15 11:53)  Authored: Brief Consult Note   Last Updated: 19-Feb-15 11:53 by Alwyn Peaallwood, Dwayne D (MD)

## 2014-09-17 NOTE — H&P (Signed)
PATIENT NAME:  Beverly Yates, Beverly Yates MR#:  [id number] DATE OF BIRTH:  07/05/1928  DATE OF ADMISSION:  06/19/2011  PRIMARY CARE PHYSICIAN: Dr. Redmond Schoolripp EMERGENCY ROOM PHYSICIAN: Dr. Sharma CovertNorman  CHIEF COMPLAINT: Lethargy, headache.   HISTORY OF PRESENT ILLNESS: Patient is an 81 year old African American female who presents with chief complaint of headache all over, severity 6/10. Patient reported that she was hurting in her "hair". She reports "bugs in my hair". She recently went home from nursing home. She has not been taking her blood pressure medications. She has had some tinnitus. Her initial blood pressure was 173/65, repeat blood pressure was 147/65. Patient had an episode of vomiting and diaphoresis. She cannot access her pain medications. She refused lumbar puncture. CT of the brain was performed, is negative.   PAST MEDICAL HISTORY: 1. Diabetes, type 2, insulin-dependent. 2. Coronary artery disease. 3. Percutaneous transluminal coronary angioplasty. 4. Stent placement.  5. Hyperlipidemia.  6. Hypertension.  7. Gallstones.  8. Iron deficiency anemia.   ALLERGIES: No known drug allergies.   CURRENT MEDICATIONS:  1. Amlodipine 10 mg p.o. daily.  2. Cozaar 100 mg p.o. daily.  3. Ferrous sulfate 325 mg p.o. daily. 4. Lasix 20 mg p.o. daily.  5. Hydralazine 50 mg p.o. t.i.d.  6. HCTZ 12.5 mg p.o. daily. 7. Lantus 22 units subcutaneous in p.Yates.  8. Metformin 1000 mg p.o. b.i.d.  9. Metoprolol 25 mg p.o. b.i.d. 10. KCL 10 mEq p.o. daily. 11. Zocor 10 mg p.o. nightly.  12. Vitamin D 2000 international units p.o. daily.   FAMILY HISTORY: Unable to provide due to her mental status.   SOCIAL HISTORY: She is a nonsmoker. Denies alcohol abuse. No history of drug abuse.   REVIEW OF SYSTEMS: Limited due to patient's mental status. CONSTITUTIONAL: Patient denies fevers, chills, night sweats. HEENT: Patient denies any hearing loss, dysphagia, visual problems, sore throat. CARDIOVASCULAR: Denies chest  pain, orthopnea, PND. RESPIRATORY: Denies cough, wheezing or hemoptysis. GASTROINTESTINAL: Patient denies any abdominal pain, hematemesis, hematochezia, melena. GENITOURINARY: Patient denies any hematuria, dysuria, or frequency. SKIN: Patient denies any lesions, rash. ENDOCRINE: Patient denies polyuria, polyphagia, polydipsia. MUSCULOSKELETAL: Patient denies any arthritis, joint effusion, swelling. HEMATOLOGICAL: Patient denies any easy bleeding or bruises. NEUROLOGICAL: Patient denies any focal weakness, seizures. PSYCHIATRIC: Denies anxiety, depression, insomnia.   PHYSICAL EXAMINATION:  VITAL SIGNS: Temperature 96.9, heart rate 64, respiratory rate 12, blood pressure 134/51, oxygen saturation 100%.   HEENT: Atraumatic, normocephalic. Pupils are equal, round, and reactive to light and accommodation. Extraocular movements are intact. Sclerae anicteric. Mucous membranes moist.   NECK: Supple. No organomegaly.   CARDIOVASCULAR: S1, S2, regular rate, rhythm. No gallops. No thrills. No murmurs.   RESPIRATORY: Lungs are clear to auscultation. No rales, no rhonchi, no wheezes, no bronchial breath sounds.   GASTROINTESTINAL: Abdomen is soft, nontender, nondistended. Normal bowel sounds. No hepatosplenomegaly.   GENITOURINARY: There is no hematuria or masses noted.   SKIN: No lesions, no rash.   ENDOCRINE: No masses, no thyromegaly.   LYMPH: No lymphadenopathy or nodes palpable.   NEUROLOGIC: Cranial nerves II through XII grossly intact. Motor strength is 5/5 bilateral upper and lower extremities. Sensation is within normal limits. No focal neurological deficit noted on examination.   MUSCULOSKELETAL: No arthritis, joint effusion, swelling.   HEMATOLOGIC: No ecchymosis, no bleeding, no petechiae noted.   EXTREMITIES: No cyanosis, no clubbing, no edema. 2+ pedal pulses noted bilaterally.   LABORATORY, DIAGNOSTIC, AND RADIOLOGICAL DATA: Electrocardiogram shows normal sinus rhythm 66 beats per  minute, nonspecific  ST-T wave changes.   Patient's CT scan of the brain is negative. Glucose 103, BUN 18, creatinine 0.93, sodium 142, potassium 3.3, chloride 101, CO2 31, calcium 9.1, estimated GFR is greater than 60, total CK 56, CK-MB less than 0.5, WBC count 7400, hemoglobin 9.9, hematocrit 31.3, platelet count 184, MCV 79. Serum salicylate level less than 1.7. Troponin 0.02. Total bilirubin 0.3, direct bilirubin less than 0.1, ALT 16, AST 18, total protein 7.3, albumin 3.7. Urinalysis shows there is 2+ leukocyte esterase, 20 WBCs, 3 RBCs.   ASSESSMENT AND PLAN:  1. Patient is an 81 year old African American female presents with chief complaint of headache, elevated blood pressure due to patient not taking her medications. Will continue all her medications in the hospital including amlodipine, Cozaar, hydrochlorothiazide and hydralazine. Monitor blood pressure.  2. Urinary tract infection. Start IV fluids, IV Rocephin. Check urine cultures.  3. Confusion, etiology is unclear. Patient has refused LP in the Emergency Department. Empirically treat patient for meningitis with IV Zosyn, Levaquin and vancomycin.  4. Diabetes. Accu-Cheks. Insulin sliding scale. Continue Lantus.  5. Hypokalemia. Replace potassium with IV fluids. Recheck in the morning.  6. Hyperlipidemia. Continue Zocor. 7. Anemia. Check iron studies, TIBC, B12, folate. Guaiac stool.  8. Deep vein thrombosis prophylaxis. Will place patient on Lovenox.   ____________________________ Donia Ast, MD jsp:cms D: 06/20/2011 00:32:47 ET T: 06/20/2011 06:24:43 ET JOB#: [id number]  cc: Donia Ast, MD, <Dictator> Dr. Florentina Jenny Donia Ast MD ELECTRONICALLY SIGNED 06/20/2011 22:42

## 2014-09-17 NOTE — Discharge Summary (Signed)
PATIENT NAME:  Beverly Yates, Beverly Yates MR#:  [id number] DATE OF BIRTH:  07/05/1928  DATE OF ADMISSION:  06/19/2011 DATE OF DISCHARGE:  06/21/2011  DISCHARGE DIAGNOSES:  1. Malignant hypertension with headache. 2. Diabetes.  3. Hypokalemia.  4. Hyperlipidemia.  5. Coronary artery disease. 6. History of anemia of apparent iron deficiency.   DISPOSITION: The patient is being discharged to her assisted living facility.   DIET: Low sodium, ADA 1800 calorie diet.   ACTIVITY: As tolerated.   DISCHARGE FOLLOWUP: Followup with primary care physician, Dr. Florentina JennyHenry Tripp, in 1 to 2 weeks after discharge.   DISCHARGE MEDICATIONS:  1. Hydralazine 50 mg three times daily. 2. Norvasc 10 mg daily.  3. Nitroglycerin 0.4 mg sublingual every 5 minutes x3 p.r.n. chest pain. 4. Lopressor 25 mg twice a day. 5. Ferrous sulfate 225 mg daily. 6. Tylenol 650 mg every 4 hours p.r.n.  7. Lantus 22 units subcutaneously at bedtime. 8. Cozaar 100 mg daily. 9. HCTZ 12.5 mg daily.  10. Zantac 150 mg twice a day. 11. Zocor 10 mg daily.  12. Vitamin D 2000 international units daily.  RESULTS: Microbiology: Urine culture showed mixed bacterial organisms.   Vitamin B12 normal. Influenza A and B negative.   CAT scan of the head shows no acute intracranial abnormality.   Hemoglobin is ranging from 9.9 to 8.6. Normal platelet count. Normal white count. Glucose 103 to 142. Potassium 3.3. LDL 44. Hemoglobin A1c 6.   HOSPITAL COURSE: The patient is an 81 year old female with past medical history of hypertension, coronary artery disease, hyperlipidemia, and iron deficiency anemia who presented with elevated blood pressure and headache. The admitting physician thought that she may have a  component of meningitis and started her on multiple antibiotics. The patient refused a LP. She had CAT scan of the head done x2, which showed no abnormalities. Her admitting blood pressure was 204/105. There was clinically no evidence of  meningitis because the patient had no fever, white count, and no nuchal rigidity, and once her blood pressure was better controlled her headache and mental status improved. The patient's presenting symptom was felt to be due to malignant hypertension. Changes were made to her medications to achieve good hypertensive control. The patient had similar presentation about a year ago and that was due to uncontrolled blood pressure. The patient's diabetes remained well controlled. Her hemoglobin A1c is 6. Her hyperlipidemia is also well controlled. Her LDL is 44. There was a slight fall in the patient's hemoglobin due to hemodilution, but it reverted back to normal once IV fluids were discontinued. Her urinalysis showed possibility of infection, however, the urine culture grew mixed bacterial organisms. The patient had no complaints of dysuria. Overall the patient returned to her baseline and is being discharged in a stable condition.  TIME SPENT: 45 minutes.  ____________________________ Darrick MeigsSangeeta Quaniya Damas, MD sp:slb D: 06/21/2011 15:07:14 ET    T: 06/23/2011 09:09:44 ET       JOB#: [id number] cc: Darrick MeigsSangeeta Coulson Wehner, MD, <Dictator> Florentina JennyHenry Tripp, MD Darrick MeigsSANGEETA Heman Que MD ELECTRONICALLY SIGNED 06/23/2011 15:11

## 2014-09-17 NOTE — Discharge Summary (Signed)
PATIENT NAME:  Beverly NorrieCHAVIS, Taygen M MR#:  [id number] DATE OF BIRTH:  07/05/1928  DATE OF ADMISSION:  10/23/2011 DATE OF DISCHARGE:  10/24/2011  ADMISSION DIAGNOSIS: Acute respiratory failure secondary to acute on chronic systolic and diastolic heart failure.   DISCHARGE DIAGNOSES:  1. Acute respiratory failure secondary to acute on chronic diastolic and systolic heart failure.  2. Acute on chronic systolic and diastolic heart failure.  3. End-stage renal disease, on hemodialysis.  4. Hypertension.  5. Diabetes.   CONSULTS: None.   LABORATORIES AT DISCHARGE: Sodium 143, potassium 4.0, chloride 101, bicarbonate 30, BUN 32, creatinine 1.20, and glucose 122.   HOSPITAL COURSE: This is an 81 year old female who presented with shortness of breath. For further details, please refer to the history and physical.  1. Acute respiratory failure in the setting of acute congestive heart failure, systolic and diastolic dysfunction, as outlined below.  2. Acute heart failure, both systolic and diastolic, on chronic. The patient was placed on IV Lasix. She diuresed well. She is negative 500 mL at discharge. Her lung sounds are clear. She will resume her outpatient medications with close follow-up for her BNP.  3. Hypertension, well controlled. She initially came in with malignant hypertension. Now she is back on her blood pressure medications. Her blood pressure is controlled.  4. Diabetes. The patient will continue her outpatient medications.   DISCHARGE MEDICATIONS:  1. Ferrous sulfate 325 mg daily.  2. Lantus 22 units at bedtime.  3. HCTZ 12.5 mg daily.  4. Ranitidine 150 mg three times daily.  5. Vitamin D3 2000 international units daily. 6. Hydralazine 50 mg three times daily. 7. Norvasc 10 mg daily.  8. KCl 10 mEq daily.  9. Metoprolol 25 mg twice a day.  10. Simvastatin 10 mg daily.  11. Losartan 100 mg daily.  12. Metformin 1000 mg twice a day.  13. Lisinopril 20 mg daily.  14. Lasix 20 mg  daily. 15. Januvia 50 mg daily.   DISCHARGE DIET: Low sodium, ADA diet.   DISCHARGE ACTIVITY: As tolerated.   DISCHARGE FOLLOWUP: The patient will need to followup with Dr. Judith[facility]Berglund [facility]  TIME SPENT: 35 minutes. ____________________________ Janyth ContesSital P. Juliene PinaMody, MD spm:slb D: 10/24/2011 13:40:08 ET T: 10/24/2011 14:22:03 ET JOB#: [id number]  cc: Othar Curto P. Juliene PinaMody, MD, <Dictator> Bari EdwardLaura Berglund, MD Janyth ContesSITAL P Shaia Porath MD ELECTRONICALLY SIGNED 10/31/2011 12:41

## 2014-09-17 NOTE — H&P (Signed)
PATIENT NAME:  Beverly Yates, Beverly Yates MR#:  [id number] DATE OF BIRTH:  07/05/1928  DATE OF ADMISSION:  10/23/2011  PRIMARY CARE PHYSICIAN: Dr. Judithann GravesBerglund.   CHIEF COMPLAINT: Shortness of breath.   HISTORY OF PRESENT ILLNESS:  81 year old female who presents with shortness of breath. The patient has been out of some of her medications over the past few days. When she woke up this morning, she was feeling short of breath. No lower extremity edema or chest pain. EMS called. Upon arrival to the ER, her blood pressure was 210/87. She had a chest x-ray which showed some mild pulmonary edema.   REVIEW OF SYSTEMS: CONSTITUTIONAL: No fever, fatigue, weakness. EYES: No glaucoma. ENT: No ear pain or epistaxis. Positive hearing loss. RESPIRATORY: No cough, wheezing, hemoptysis, or chronic obstructive pulmonary disease. CARDIOVASCULAR: Denies any chest pain, palpitations, orthopnea, syncope, edema, arrhythmia. Positive shortness of breath. GASTROINTESTINAL: No nausea, vomiting, diarrhea, abdominal pain, melena, or ulcers. GU: No dysuria or hematuria. ENDOCRINE: No polyuria or polydipsia. HEME/LYMPH: Positive easy bruising and anemia. SKIN: No rash or lesions. MUSCULOSKELETAL: Some limited activity due to her age. NEUROLOGIC: No history of cerebrovascular accident, transient ischemic attack or seizures. PSYCH: No history of anxiety or depression.   PAST MEDICAL HISTORY:  1. Type 2 diabetes.  2. Coronary artery disease status post stent.  3. Hyperlipidemia.  4. Hypertension.   MEDICATIONS:  1. Aspirin 81 mg daily.  2. Metoprolol 25 mg b.i.d.  3. Ranitidine 150 b.i.d.  4. KCl 10 mEq daily.  5. Simvastatin 10 mg at bedtime.  6. Metformin 1000 mg b.i.d.  7. Losartan 100 mg daily.  8. Lisinopril 20 mg daily.  9. Lantus 22 units subcutaneous as directed.  10. Lasix 20 mg daily.  11. Hydralazine 50 mg t.i.d.  12. Januvia 50 mg daily.  13. HCTZ 12.5 daily.  14. Norvasc 10 mg daily. 15. Vitamin D3 2000 international  units daily. 16. Ferrous sulfate 325, one tablet daily.   FAMILY HISTORY: Positive for diabetes, heart disease and hypertension.   SOCIAL HISTORY: The patient denies any alcohol or IV drug use.   PAST SURGICAL HISTORY: Cardiac stents.   PHYSICAL EXAMINATION:  VITAL SIGNS: Temperature 98.1, pulse 78, respirations 20, blood pressure on arrival 210/87, 94% on 2 liters.   GENERAL: The patient is alert and oriented, not in acute distress.   HEENT: Head is atraumatic. Pupils are round. Anicteric sclerae. Mucous membranes are moist. Oropharynx is clear.   NECK: Supple without jugular venous distention, carotid bruit, or enlarged thyroid.   CARDIOVASCULAR: 2/6 systolic ejection murmur heard best at the right sternal border without radiation. PMI is not displaced.   LUNGS: Crackles at the bases. No wheezing or dullness is heard.   ABDOMEN: Bowel sounds positive. Nontender, nondistended. No hepatosplenomegaly.   EXTREMITIES: No clubbing, cyanosis or edema.   NEUROLOGIC: Cranial nerves 2-12 are intact. No focal deficits.   SKIN: Without rash or lesions.   MUSCULOSKELETAL: 5/5 strength in all extremities.   LABORATORY, DIAGNOSTIC AND RADIOLOGICAL DATA: Sodium 142, potassium 4.2, chloride 104, bicarbonate 28, BUN 14, creatinine 0.70. Glucose 104. BNP 909, calcium 9.3. White blood cells 10, hemoglobin 11, hematocrit 35.4, platelets 234, INR is 1.0. Urinalysis shows 2+ bacteria, negative LCE and nitrites. Chest x-ray shows mild pulmonary edema. EKG shows sinus rhythm with PVCs.   ASSESSMENT AND PLAN: An 81 year old female with a history of coronary artery disease with stents, hypertension, diabetes, who presents with shortness of breath, found to have congestive heart failure and malignant  hypertension.   1. Acute respiratory failure secondary to acute systolic heart failure. The patient had an echocardiogram August 2010 which showed an ejection fraction of 50%. She also has dilated LA and RA  with moderate MR. I suspect her heart failure is a combination of systolic and diastolic dysfunction. Plan as outlined below.  2. Acute on chronic systolic and diastolic heart failure from malignant hypertension. The patient has some mild pulmonary edema. We will treat with Lasix. Monitor input and output, daily weights. Continue other outpatient medications.  3. Malignant hypertension from running out of her medications. We will restart her outpatient medications and monitor her blood pressure closely.  4. Diabetes. The patient is on sliding scale insulin, ADA diet. We will continue Januvia and Lantus.  5. History of coronary artery disease. We will continue aspirin, statin, and metoprolol.   CODE STATUS: FULL CODE.   TIME SPENT: Approximately 50 minutes.   ____________________________ Janyth Contes. Juliene Pina, MD spm:ap D: 10/23/2011 10:48:31 ET T: 10/23/2011 11:13:08 ET JOB#: [id number]  cc: Zacchaeus Halm P. Juliene Pina, MD, <Dictator> Bari Edward, MD Janyth Contes Rakeem Colley MD ELECTRONICALLY SIGNED 10/31/2011 12:41
